# Patient Record
Sex: Female | Born: 1950 | Race: White | Hispanic: No | Marital: Married | Smoking: Former smoker
Health system: Southern US, Community
[De-identification: ages and names within clinical notes are randomized; demographics above are authoritative.]

## PROBLEM LIST (undated history)

## (undated) DIAGNOSIS — M1A00X0 Idiopathic chronic gout, unspecified site, without tophus (tophi): Secondary | ICD-10-CM

## (undated) DIAGNOSIS — K219 Gastro-esophageal reflux disease without esophagitis: Secondary | ICD-10-CM

## (undated) DIAGNOSIS — N2581 Secondary hyperparathyroidism of renal origin: Secondary | ICD-10-CM

## (undated) DIAGNOSIS — I10 Essential (primary) hypertension: Secondary | ICD-10-CM

## (undated) DIAGNOSIS — D649 Anemia, unspecified: Secondary | ICD-10-CM

## (undated) DIAGNOSIS — N289 Disorder of kidney and ureter, unspecified: Secondary | ICD-10-CM

## (undated) DIAGNOSIS — R0600 Dyspnea, unspecified: Secondary | ICD-10-CM

## (undated) DIAGNOSIS — Z972 Presence of dental prosthetic device (complete) (partial): Secondary | ICD-10-CM

## (undated) DIAGNOSIS — M1990 Unspecified osteoarthritis, unspecified site: Secondary | ICD-10-CM

## (undated) DIAGNOSIS — E559 Vitamin D deficiency, unspecified: Secondary | ICD-10-CM

## (undated) DIAGNOSIS — R946 Abnormal results of thyroid function studies: Secondary | ICD-10-CM

## (undated) DIAGNOSIS — J90 Pleural effusion, not elsewhere classified: Secondary | ICD-10-CM

## (undated) DIAGNOSIS — I2510 Atherosclerotic heart disease of native coronary artery without angina pectoris: Secondary | ICD-10-CM

## (undated) DIAGNOSIS — R700 Elevated erythrocyte sedimentation rate: Secondary | ICD-10-CM

## (undated) DIAGNOSIS — N183 Chronic kidney disease, stage 3 (moderate): Secondary | ICD-10-CM

## (undated) DIAGNOSIS — E785 Hyperlipidemia, unspecified: Secondary | ICD-10-CM

## (undated) DIAGNOSIS — R768 Other specified abnormal immunological findings in serum: Secondary | ICD-10-CM

## (undated) DIAGNOSIS — E790 Hyperuricemia without signs of inflammatory arthritis and tophaceous disease: Secondary | ICD-10-CM

## (undated) HISTORY — DX: Abnormal results of thyroid function studies: R94.6

## (undated) HISTORY — DX: Hyperuricemia without signs of inflammatory arthritis and tophaceous disease: E79.0

## (undated) HISTORY — DX: Elevated erythrocyte sedimentation rate: R70.0

## (undated) HISTORY — DX: Chronic kidney disease, stage 3 (moderate): N18.3

## (undated) HISTORY — DX: Hyperlipidemia, unspecified: E78.5

## (undated) HISTORY — PX: TUBAL LIGATION: SHX77

## (undated) HISTORY — DX: Vitamin D deficiency, unspecified: E55.9

## (undated) HISTORY — DX: Secondary hyperparathyroidism of renal origin: N25.81

## (undated) HISTORY — DX: Idiopathic chronic gout, unspecified site, without tophus (tophi): M1A.00X0

## (undated) HISTORY — DX: Atherosclerotic heart disease of native coronary artery without angina pectoris: I25.10

## (undated) HISTORY — DX: Other specified abnormal immunological findings in serum: R76.8

---

## 1998-07-20 HISTORY — PX: DILATION AND CURETTAGE OF UTERUS: SHX78

## 2005-11-23 ENCOUNTER — Emergency Department: Payer: Self-pay | Admitting: Emergency Medicine

## 2009-09-08 ENCOUNTER — Emergency Department: Payer: Self-pay | Admitting: Emergency Medicine

## 2014-05-31 DIAGNOSIS — I10 Essential (primary) hypertension: Secondary | ICD-10-CM | POA: Insufficient documentation

## 2014-05-31 DIAGNOSIS — E785 Hyperlipidemia, unspecified: Secondary | ICD-10-CM

## 2014-05-31 HISTORY — DX: Hyperlipidemia, unspecified: E78.5

## 2015-07-21 DIAGNOSIS — N2581 Secondary hyperparathyroidism of renal origin: Secondary | ICD-10-CM

## 2015-07-21 DIAGNOSIS — E559 Vitamin D deficiency, unspecified: Secondary | ICD-10-CM

## 2015-07-21 HISTORY — DX: Secondary hyperparathyroidism of renal origin: N25.81

## 2015-07-21 HISTORY — DX: Vitamin D deficiency, unspecified: E55.9

## 2015-09-20 DIAGNOSIS — R05 Cough: Secondary | ICD-10-CM | POA: Diagnosis not present

## 2015-09-20 DIAGNOSIS — N390 Urinary tract infection, site not specified: Secondary | ICD-10-CM | POA: Diagnosis not present

## 2015-09-23 DIAGNOSIS — L298 Other pruritus: Secondary | ICD-10-CM | POA: Diagnosis not present

## 2015-10-09 DIAGNOSIS — E7800 Pure hypercholesterolemia, unspecified: Secondary | ICD-10-CM | POA: Diagnosis not present

## 2015-10-09 DIAGNOSIS — M5136 Other intervertebral disc degeneration, lumbar region: Secondary | ICD-10-CM | POA: Diagnosis not present

## 2015-10-09 DIAGNOSIS — I10 Essential (primary) hypertension: Secondary | ICD-10-CM | POA: Diagnosis not present

## 2015-10-09 DIAGNOSIS — M545 Low back pain: Secondary | ICD-10-CM | POA: Diagnosis not present

## 2016-04-13 DIAGNOSIS — E7800 Pure hypercholesterolemia, unspecified: Secondary | ICD-10-CM | POA: Diagnosis not present

## 2016-04-13 DIAGNOSIS — Z6841 Body Mass Index (BMI) 40.0 and over, adult: Secondary | ICD-10-CM | POA: Diagnosis not present

## 2016-04-13 DIAGNOSIS — I10 Essential (primary) hypertension: Secondary | ICD-10-CM | POA: Diagnosis not present

## 2016-05-19 DIAGNOSIS — N183 Chronic kidney disease, stage 3 (moderate): Secondary | ICD-10-CM | POA: Diagnosis not present

## 2016-05-19 DIAGNOSIS — R809 Proteinuria, unspecified: Secondary | ICD-10-CM | POA: Diagnosis not present

## 2016-05-19 DIAGNOSIS — I10 Essential (primary) hypertension: Secondary | ICD-10-CM | POA: Diagnosis not present

## 2016-06-02 DIAGNOSIS — N183 Chronic kidney disease, stage 3 (moderate): Secondary | ICD-10-CM | POA: Diagnosis not present

## 2016-06-23 DIAGNOSIS — N183 Chronic kidney disease, stage 3 (moderate): Secondary | ICD-10-CM | POA: Diagnosis not present

## 2016-06-23 DIAGNOSIS — N2581 Secondary hyperparathyroidism of renal origin: Secondary | ICD-10-CM | POA: Diagnosis not present

## 2016-06-23 DIAGNOSIS — I10 Essential (primary) hypertension: Secondary | ICD-10-CM | POA: Diagnosis not present

## 2016-06-23 DIAGNOSIS — R809 Proteinuria, unspecified: Secondary | ICD-10-CM | POA: Diagnosis not present

## 2016-07-04 DIAGNOSIS — Z885 Allergy status to narcotic agent status: Secondary | ICD-10-CM | POA: Diagnosis not present

## 2016-07-04 DIAGNOSIS — R918 Other nonspecific abnormal finding of lung field: Secondary | ICD-10-CM | POA: Diagnosis not present

## 2016-07-04 DIAGNOSIS — Z87891 Personal history of nicotine dependence: Secondary | ICD-10-CM | POA: Diagnosis not present

## 2016-07-04 DIAGNOSIS — R079 Chest pain, unspecified: Secondary | ICD-10-CM | POA: Diagnosis not present

## 2016-07-04 DIAGNOSIS — M25512 Pain in left shoulder: Secondary | ICD-10-CM | POA: Diagnosis not present

## 2016-07-04 DIAGNOSIS — I700 Atherosclerosis of aorta: Secondary | ICD-10-CM | POA: Diagnosis not present

## 2016-07-04 DIAGNOSIS — I10 Essential (primary) hypertension: Secondary | ICD-10-CM | POA: Diagnosis not present

## 2016-07-04 DIAGNOSIS — M542 Cervicalgia: Secondary | ICD-10-CM | POA: Diagnosis not present

## 2016-07-04 DIAGNOSIS — M549 Dorsalgia, unspecified: Secondary | ICD-10-CM | POA: Diagnosis not present

## 2016-07-05 DIAGNOSIS — I700 Atherosclerosis of aorta: Secondary | ICD-10-CM | POA: Diagnosis not present

## 2016-07-05 DIAGNOSIS — R079 Chest pain, unspecified: Secondary | ICD-10-CM | POA: Diagnosis not present

## 2016-07-08 DIAGNOSIS — I10 Essential (primary) hypertension: Secondary | ICD-10-CM | POA: Diagnosis not present

## 2016-07-08 DIAGNOSIS — M542 Cervicalgia: Secondary | ICD-10-CM | POA: Diagnosis not present

## 2016-09-09 DIAGNOSIS — L918 Other hypertrophic disorders of the skin: Secondary | ICD-10-CM | POA: Diagnosis not present

## 2016-10-07 DIAGNOSIS — L821 Other seborrheic keratosis: Secondary | ICD-10-CM | POA: Diagnosis not present

## 2016-10-07 DIAGNOSIS — L918 Other hypertrophic disorders of the skin: Secondary | ICD-10-CM | POA: Diagnosis not present

## 2016-10-21 DIAGNOSIS — Z1159 Encounter for screening for other viral diseases: Secondary | ICD-10-CM | POA: Diagnosis not present

## 2016-10-21 DIAGNOSIS — N183 Chronic kidney disease, stage 3 (moderate): Secondary | ICD-10-CM | POA: Diagnosis not present

## 2016-10-21 DIAGNOSIS — Z6841 Body Mass Index (BMI) 40.0 and over, adult: Secondary | ICD-10-CM | POA: Diagnosis not present

## 2016-10-21 DIAGNOSIS — E7800 Pure hypercholesterolemia, unspecified: Secondary | ICD-10-CM | POA: Diagnosis not present

## 2016-10-21 DIAGNOSIS — R739 Hyperglycemia, unspecified: Secondary | ICD-10-CM | POA: Diagnosis not present

## 2016-10-21 DIAGNOSIS — I10 Essential (primary) hypertension: Secondary | ICD-10-CM | POA: Diagnosis not present

## 2016-10-27 DIAGNOSIS — R809 Proteinuria, unspecified: Secondary | ICD-10-CM | POA: Diagnosis not present

## 2016-10-27 DIAGNOSIS — N2581 Secondary hyperparathyroidism of renal origin: Secondary | ICD-10-CM | POA: Diagnosis not present

## 2016-10-27 DIAGNOSIS — I10 Essential (primary) hypertension: Secondary | ICD-10-CM | POA: Diagnosis not present

## 2016-10-27 DIAGNOSIS — N183 Chronic kidney disease, stage 3 (moderate): Secondary | ICD-10-CM | POA: Diagnosis not present

## 2016-11-09 DIAGNOSIS — R809 Proteinuria, unspecified: Secondary | ICD-10-CM | POA: Diagnosis not present

## 2016-11-09 DIAGNOSIS — N183 Chronic kidney disease, stage 3 (moderate): Secondary | ICD-10-CM | POA: Diagnosis not present

## 2016-11-09 DIAGNOSIS — I10 Essential (primary) hypertension: Secondary | ICD-10-CM | POA: Diagnosis not present

## 2016-11-09 DIAGNOSIS — Z6841 Body Mass Index (BMI) 40.0 and over, adult: Secondary | ICD-10-CM | POA: Diagnosis not present

## 2016-12-21 DIAGNOSIS — J020 Streptococcal pharyngitis: Secondary | ICD-10-CM | POA: Diagnosis not present

## 2016-12-21 DIAGNOSIS — M62838 Other muscle spasm: Secondary | ICD-10-CM | POA: Diagnosis not present

## 2016-12-21 DIAGNOSIS — J029 Acute pharyngitis, unspecified: Secondary | ICD-10-CM | POA: Diagnosis not present

## 2017-01-21 DIAGNOSIS — Z0000 Encounter for general adult medical examination without abnormal findings: Secondary | ICD-10-CM | POA: Diagnosis not present

## 2017-01-27 DIAGNOSIS — N183 Chronic kidney disease, stage 3 unspecified: Secondary | ICD-10-CM | POA: Insufficient documentation

## 2017-01-27 DIAGNOSIS — R0602 Shortness of breath: Secondary | ICD-10-CM | POA: Diagnosis not present

## 2017-01-27 DIAGNOSIS — D259 Leiomyoma of uterus, unspecified: Secondary | ICD-10-CM | POA: Diagnosis not present

## 2017-01-27 DIAGNOSIS — I129 Hypertensive chronic kidney disease with stage 1 through stage 4 chronic kidney disease, or unspecified chronic kidney disease: Secondary | ICD-10-CM | POA: Diagnosis not present

## 2017-01-27 DIAGNOSIS — R200 Anesthesia of skin: Secondary | ICD-10-CM | POA: Diagnosis not present

## 2017-01-27 DIAGNOSIS — E559 Vitamin D deficiency, unspecified: Secondary | ICD-10-CM | POA: Diagnosis not present

## 2017-01-27 DIAGNOSIS — Z885 Allergy status to narcotic agent status: Secondary | ICD-10-CM | POA: Diagnosis not present

## 2017-01-27 DIAGNOSIS — R202 Paresthesia of skin: Secondary | ICD-10-CM | POA: Diagnosis not present

## 2017-01-27 DIAGNOSIS — E785 Hyperlipidemia, unspecified: Secondary | ICD-10-CM | POA: Diagnosis not present

## 2017-01-27 DIAGNOSIS — Z6841 Body Mass Index (BMI) 40.0 and over, adult: Secondary | ICD-10-CM | POA: Diagnosis not present

## 2017-01-27 DIAGNOSIS — Z7982 Long term (current) use of aspirin: Secondary | ICD-10-CM | POA: Diagnosis not present

## 2017-01-27 DIAGNOSIS — N83201 Unspecified ovarian cyst, right side: Secondary | ICD-10-CM | POA: Diagnosis not present

## 2017-01-27 DIAGNOSIS — R0789 Other chest pain: Secondary | ICD-10-CM | POA: Diagnosis not present

## 2017-01-27 DIAGNOSIS — N83202 Unspecified ovarian cyst, left side: Secondary | ICD-10-CM | POA: Diagnosis not present

## 2017-01-27 DIAGNOSIS — R079 Chest pain, unspecified: Secondary | ICD-10-CM | POA: Diagnosis not present

## 2017-01-27 DIAGNOSIS — R000 Tachycardia, unspecified: Secondary | ICD-10-CM | POA: Diagnosis not present

## 2017-01-27 DIAGNOSIS — Z87891 Personal history of nicotine dependence: Secondary | ICD-10-CM | POA: Diagnosis not present

## 2017-01-27 HISTORY — DX: Chronic kidney disease, stage 3 unspecified: N18.30

## 2017-01-28 DIAGNOSIS — R079 Chest pain, unspecified: Secondary | ICD-10-CM | POA: Insufficient documentation

## 2017-01-28 DIAGNOSIS — N83201 Unspecified ovarian cyst, right side: Secondary | ICD-10-CM | POA: Diagnosis not present

## 2017-01-28 DIAGNOSIS — I700 Atherosclerosis of aorta: Secondary | ICD-10-CM | POA: Diagnosis not present

## 2017-01-28 DIAGNOSIS — R200 Anesthesia of skin: Secondary | ICD-10-CM | POA: Diagnosis not present

## 2017-01-28 DIAGNOSIS — I519 Heart disease, unspecified: Secondary | ICD-10-CM | POA: Diagnosis not present

## 2017-01-28 DIAGNOSIS — I517 Cardiomegaly: Secondary | ICD-10-CM | POA: Diagnosis not present

## 2017-01-28 DIAGNOSIS — N83202 Unspecified ovarian cyst, left side: Secondary | ICD-10-CM | POA: Diagnosis not present

## 2017-02-03 DIAGNOSIS — I25119 Atherosclerotic heart disease of native coronary artery with unspecified angina pectoris: Secondary | ICD-10-CM | POA: Diagnosis not present

## 2017-02-03 DIAGNOSIS — R079 Chest pain, unspecified: Secondary | ICD-10-CM | POA: Diagnosis not present

## 2017-02-12 DIAGNOSIS — I10 Essential (primary) hypertension: Secondary | ICD-10-CM | POA: Diagnosis not present

## 2017-02-12 DIAGNOSIS — N183 Chronic kidney disease, stage 3 (moderate): Secondary | ICD-10-CM | POA: Diagnosis not present

## 2017-02-12 DIAGNOSIS — R0789 Other chest pain: Secondary | ICD-10-CM | POA: Diagnosis not present

## 2017-02-12 DIAGNOSIS — E7800 Pure hypercholesterolemia, unspecified: Secondary | ICD-10-CM | POA: Diagnosis not present

## 2017-02-12 DIAGNOSIS — R946 Abnormal results of thyroid function studies: Secondary | ICD-10-CM | POA: Diagnosis not present

## 2017-02-12 DIAGNOSIS — Z09 Encounter for follow-up examination after completed treatment for conditions other than malignant neoplasm: Secondary | ICD-10-CM | POA: Diagnosis not present

## 2017-02-17 DIAGNOSIS — I2510 Atherosclerotic heart disease of native coronary artery without angina pectoris: Secondary | ICD-10-CM | POA: Diagnosis not present

## 2017-02-17 DIAGNOSIS — E7800 Pure hypercholesterolemia, unspecified: Secondary | ICD-10-CM | POA: Diagnosis not present

## 2017-02-17 DIAGNOSIS — I10 Essential (primary) hypertension: Secondary | ICD-10-CM | POA: Diagnosis not present

## 2017-02-17 HISTORY — DX: Atherosclerotic heart disease of native coronary artery without angina pectoris: I25.10

## 2017-03-02 DIAGNOSIS — N183 Chronic kidney disease, stage 3 (moderate): Secondary | ICD-10-CM | POA: Diagnosis not present

## 2017-03-02 DIAGNOSIS — R809 Proteinuria, unspecified: Secondary | ICD-10-CM | POA: Diagnosis not present

## 2017-03-02 DIAGNOSIS — I10 Essential (primary) hypertension: Secondary | ICD-10-CM | POA: Diagnosis not present

## 2017-03-02 DIAGNOSIS — N2581 Secondary hyperparathyroidism of renal origin: Secondary | ICD-10-CM | POA: Diagnosis not present

## 2017-06-22 DIAGNOSIS — N183 Chronic kidney disease, stage 3 (moderate): Secondary | ICD-10-CM | POA: Diagnosis not present

## 2017-06-22 DIAGNOSIS — I10 Essential (primary) hypertension: Secondary | ICD-10-CM | POA: Diagnosis not present

## 2017-06-22 DIAGNOSIS — N2581 Secondary hyperparathyroidism of renal origin: Secondary | ICD-10-CM | POA: Diagnosis not present

## 2017-06-22 DIAGNOSIS — R809 Proteinuria, unspecified: Secondary | ICD-10-CM | POA: Diagnosis not present

## 2017-07-26 DIAGNOSIS — Z6841 Body Mass Index (BMI) 40.0 and over, adult: Secondary | ICD-10-CM | POA: Diagnosis not present

## 2017-07-26 DIAGNOSIS — I10 Essential (primary) hypertension: Secondary | ICD-10-CM | POA: Diagnosis not present

## 2017-07-26 DIAGNOSIS — E7800 Pure hypercholesterolemia, unspecified: Secondary | ICD-10-CM | POA: Diagnosis not present

## 2017-07-26 DIAGNOSIS — N183 Chronic kidney disease, stage 3 (moderate): Secondary | ICD-10-CM | POA: Diagnosis not present

## 2017-07-26 DIAGNOSIS — Z79899 Other long term (current) drug therapy: Secondary | ICD-10-CM | POA: Diagnosis not present

## 2017-08-03 DIAGNOSIS — I2510 Atherosclerotic heart disease of native coronary artery without angina pectoris: Secondary | ICD-10-CM | POA: Diagnosis not present

## 2017-08-03 DIAGNOSIS — I10 Essential (primary) hypertension: Secondary | ICD-10-CM | POA: Diagnosis not present

## 2017-08-03 DIAGNOSIS — E7800 Pure hypercholesterolemia, unspecified: Secondary | ICD-10-CM | POA: Diagnosis not present

## 2017-08-03 DIAGNOSIS — N183 Chronic kidney disease, stage 3 (moderate): Secondary | ICD-10-CM | POA: Diagnosis not present

## 2017-10-04 ENCOUNTER — Emergency Department: Payer: PPO

## 2017-10-04 ENCOUNTER — Other Ambulatory Visit: Payer: Self-pay

## 2017-10-04 ENCOUNTER — Emergency Department
Admission: EM | Admit: 2017-10-04 | Discharge: 2017-10-04 | Disposition: A | Discharge status: Home / Self Care | Payer: PPO | Attending: Emergency Medicine | Admitting: Emergency Medicine

## 2017-10-04 DIAGNOSIS — Z79899 Other long term (current) drug therapy: Secondary | ICD-10-CM | POA: Insufficient documentation

## 2017-10-04 DIAGNOSIS — I10 Essential (primary) hypertension: Secondary | ICD-10-CM | POA: Insufficient documentation

## 2017-10-04 DIAGNOSIS — Z7982 Long term (current) use of aspirin: Secondary | ICD-10-CM | POA: Diagnosis not present

## 2017-10-04 DIAGNOSIS — M19011 Primary osteoarthritis, right shoulder: Secondary | ICD-10-CM | POA: Diagnosis not present

## 2017-10-04 DIAGNOSIS — M25511 Pain in right shoulder: Secondary | ICD-10-CM | POA: Diagnosis not present

## 2017-10-04 HISTORY — DX: Unspecified osteoarthritis, unspecified site: M19.90

## 2017-10-04 HISTORY — DX: Essential (primary) hypertension: I10

## 2017-10-04 HISTORY — DX: Hyperlipidemia, unspecified: E78.5

## 2017-10-04 HISTORY — DX: Disorder of kidney and ureter, unspecified: N28.9

## 2017-10-04 MED ORDER — KETOROLAC TROMETHAMINE 60 MG/2ML IM SOLN
60.0000 mg | Freq: Once | INTRAMUSCULAR | Status: AC
  Administered 2017-10-04: 60 mg via INTRAMUSCULAR
  Filled 2017-10-04: qty 2

## 2017-10-04 MED ORDER — MELOXICAM 7.5 MG PO TABS: 7.5000 mg | ORAL_TABLET | Freq: Every day | ORAL | 2 refills | Status: DC

## 2017-10-04 NOTE — ED Triage Notes (Signed)
Pt c/o right shoulder pain with decreased movement since yesterday. Denies injury.Marland Kitchen

## 2017-10-04 NOTE — ED Notes (Signed)
Computer did not allow patient to e-sign discharge.  Patient verbalized understanding of discharge instructions.  This RN witnessed.

## 2017-10-04 NOTE — ED Notes (Signed)
See triage note  Presents with pain to right shoulder   States pain started this am  States pain is going from shoulder and moves into right arm  Pain increases with movement  No deformity noted   Good pulses

## 2017-10-04 NOTE — Discharge Instructions (Signed)
Arm sling for 2-3 days as needed

## 2017-10-04 NOTE — ED Provider Notes (Signed)
[facility] Emergency Department Provider Note   ____________________________________________   None    (approximate)  I have reviewed the triage vital signs and the nursing notes.   HISTORY  Chief Complaint Shoulder Pain    HPI Pamela Young is a 67 y.o. female complaint onset of acute right shoulder pain and decreased range of motion with abduction overhead reaching for 1 day.  Patient denies provocative incident for complaint.  Patient state has a history of arthritis.  Patient the pain is located at the [facility] joint.  Patient rates the pain as a 10/10.  No palates measured for complaint.  Patient denies loss of sensation.  Patient is right-hand dominant.   Past Medical History:  Diagnosis Date  . Arthritis   . Hyperlipemia   . Hypertension   . Renal disorder     There are no active problems to display for this patient.   Past Surgical History:  Procedure Laterality Date  . TUBAL LIGATION      Prior to Admission medications   Medication Sig Start Date End Date Taking? Authorizing Provider  amLODipine (NORVASC) 10 MG tablet Take 10 mg by mouth daily.   Yes [provider]  aspirin 81 MG chewable tablet Chew 81 mg by mouth daily.   Yes [provider]  atenolol (TENORMIN) 25 MG tablet Take 25 mg by mouth daily.   Yes [provider]  losartan (COZAAR) 100 MG tablet Take 100 mg by mouth daily.   Yes [provider]  lovastatin (MEVACOR) 20 MG tablet Take 20 mg by mouth at bedtime.   Yes [provider]  triamterene-hydrochlorothiazide (MAXZIDE) 75-50 MG tablet Take 1 tablet by mouth daily.   Yes [provider]  meloxicam (MOBIC) 7.5 MG tablet Take 1 tablet (7.5 mg total) by mouth daily. 10/04/17   Sable Feil, PA-C    Allergies Patient has no known allergies.  No family history on file.  Social History Social History   Tobacco Use  . Smoking status: Never Smoker  . Smokeless  tobacco: Never Used  Substance Use Topics  . Alcohol use: No    Frequency: Never  . Drug use: No    Review of Systems  Constitutional: No fever/chills Eyes: No visual changes. ENT: No sore throat. Cardiovascular: Denies chest pain. Respiratory: Denies shortness of breath. Gastrointestinal: No abdominal pain.  No nausea, no vomiting.  No diarrhea.  No constipation. Genitourinary: Negative for dysuria. Musculoskeletal: Right shoulder pain. Skin: Negative for rash. Neurological: Negative for headaches, focal weakness or numbness. Endocrine:Hyperlipidemia and hypertension.  Renal disorder.  ____________________________________________   PHYSICAL EXAM:  VITAL SIGNS: ED Triage Vitals [10/04/17 1623]  Enc Vitals Group     BP (!) 141/51     Pulse Rate (!) 106     Resp 18     Temp 98.4 F (36.9 C)     Temp Source Oral     SpO2 96 %     Weight 280 lb (127 kg)     Height 5\' 7"  (1.702 m)     Head Circumference      Peak Flow      Pain Score 10     Pain Loc      Pain Edu?      Excl. in [place]?    Constitutional: Alert and oriented. Well appearing and in no acute distress. Cardiovascular: Normal rate, regular rhythm. Grossly normal heart sounds.  Good peripheral circulation. Respiratory: Normal respiratory effort.  No  retractions. Lungs CTAB. Musculoskeletal: No obvious deformity to the right shoulder.  Patient has moderate guarding palpation to [facility] joint.  Decreased range of motion abduction overhead reaching.  Strength against resistance 3/5. Neurologic:  Normal speech and language. No gross focal neurologic deficits are appreciated. No gait instability. Skin:  Skin is warm, dry and intact. No rash noted. Psychiatric: Mood and affect are normal. Speech and behavior are normal.  ____________________________________________   LABS (all labs ordered are listed, but only abnormal results are displayed)  Labs Reviewed - No data to  display ____________________________________________  EKG   ____________________________________________  RADIOLOGY Mild degenerative changes of the [facility] joint right shoulder.   Official radiology report(s): Dg Shoulder Right  Result Date: 10/04/2017 CLINICAL DATA:  Right shoulder pain EXAM: RIGHT SHOULDER - 2+ VIEW COMPARISON:  None. FINDINGS: No fracture or dislocation.  Mild AC joint degenerative change. IMPRESSION: No acute osseous abnormality. Electronically Signed   By: Donavan Foil M.D.   On: 10/04/2017 17:24    ____________________________________________   PROCEDURES  Procedure(s) performed: None  Procedures  Critical Care performed: No  ____________________________________________   INITIAL IMPRESSION / ASSESSMENT AND PLAN / ED COURSE  As part of my medical decision making, I reviewed the following data within the electronic MEDICAL RECORD NUMBER  Right shoulder pain secondary to osteoarthritis.  Discussed x-ray findings with patient.  Patient given discharge care instruction advised follow-up with the [facility] for continued care.  Arm sling for 2-3 days as needed.  Take medication as directed.        ____________________________________________   FINAL CLINICAL IMPRESSION(S) / ED DIAGNOSES  Final diagnoses:  Primary osteoarthritis of right shoulder     ED Discharge Orders        Ordered    meloxicam (MOBIC) 7.5 MG tablet  Daily     10/04/17 1736       Note:  This document was prepared using Dragon voice recognition software and may include unintentional dictation errors.    Sable Feil, PA-C 10/04/17 1740    Darel Hong, MD 10/04/17 [phone]

## 2017-10-14 DIAGNOSIS — M25542 Pain in joints of left hand: Secondary | ICD-10-CM | POA: Diagnosis not present

## 2017-10-14 DIAGNOSIS — M25562 Pain in left knee: Secondary | ICD-10-CM | POA: Diagnosis not present

## 2017-10-20 DIAGNOSIS — R809 Proteinuria, unspecified: Secondary | ICD-10-CM | POA: Diagnosis not present

## 2017-10-20 DIAGNOSIS — N183 Chronic kidney disease, stage 3 (moderate): Secondary | ICD-10-CM | POA: Diagnosis not present

## 2017-10-20 DIAGNOSIS — N2581 Secondary hyperparathyroidism of renal origin: Secondary | ICD-10-CM | POA: Diagnosis not present

## 2017-10-20 DIAGNOSIS — I10 Essential (primary) hypertension: Secondary | ICD-10-CM | POA: Diagnosis not present

## 2017-11-11 DIAGNOSIS — M19031 Primary osteoarthritis, right wrist: Secondary | ICD-10-CM | POA: Diagnosis not present

## 2017-11-11 DIAGNOSIS — M19032 Primary osteoarthritis, left wrist: Secondary | ICD-10-CM | POA: Diagnosis not present

## 2017-11-21 ENCOUNTER — Encounter: Payer: Self-pay | Admitting: Emergency Medicine

## 2017-11-21 ENCOUNTER — Emergency Department
Admission: EM | Admit: 2017-11-21 | Discharge: 2017-11-21 | Disposition: A | Discharge status: Home / Self Care | Payer: PPO | Attending: Emergency Medicine | Admitting: Emergency Medicine

## 2017-11-21 ENCOUNTER — Other Ambulatory Visit: Payer: Self-pay

## 2017-11-21 DIAGNOSIS — I10 Essential (primary) hypertension: Secondary | ICD-10-CM | POA: Insufficient documentation

## 2017-11-21 DIAGNOSIS — Z7982 Long term (current) use of aspirin: Secondary | ICD-10-CM | POA: Insufficient documentation

## 2017-11-21 DIAGNOSIS — R319 Hematuria, unspecified: Secondary | ICD-10-CM | POA: Diagnosis present

## 2017-11-21 DIAGNOSIS — N390 Urinary tract infection, site not specified: Secondary | ICD-10-CM | POA: Diagnosis not present

## 2017-11-21 DIAGNOSIS — Z79899 Other long term (current) drug therapy: Secondary | ICD-10-CM | POA: Insufficient documentation

## 2017-11-21 LAB — CBC
HCT: 33.7 % — ABNORMAL LOW (ref 35.0–47.0)
Hemoglobin: 11.6 g/dL — ABNORMAL LOW (ref 12.0–16.0)
MCH: 30.7 pg (ref 26.0–34.0)
MCHC: 34.5 g/dL (ref 32.0–36.0)
MCV: 88.9 fL (ref 80.0–100.0)
Platelets: 299 10*3/uL (ref 150–440)
RBC: 3.79 MIL/uL — ABNORMAL LOW (ref 3.80–5.20)
RDW: 15.1 % — AB (ref 11.5–14.5)
WBC: 13.6 10*3/uL — AB (ref 3.6–11.0)

## 2017-11-21 LAB — BASIC METABOLIC PANEL
ANION GAP: 9 (ref 5–15)
BUN: 45 mg/dL — ABNORMAL HIGH (ref 6–20)
CALCIUM: 8.9 mg/dL (ref 8.9–10.3)
CO2: 18 mmol/L — ABNORMAL LOW (ref 22–32)
Chloride: 105 mmol/L (ref 101–111)
Creatinine, Ser: 1.5 mg/dL — ABNORMAL HIGH (ref 0.44–1.00)
GFR, EST AFRICAN AMERICAN: 40 mL/min — AB (ref >60)
GFR, EST NON AFRICAN AMERICAN: 35 mL/min — AB (ref >60)
GLUCOSE: 139 mg/dL — AB (ref 65–99)
Potassium: 4.2 mmol/L (ref 3.5–5.1)
Sodium: 132 mmol/L — ABNORMAL LOW (ref 135–145)

## 2017-11-21 LAB — URINALYSIS, COMPLETE (UACMP) WITH MICROSCOPIC
Bacteria, UA: NONE SEEN
Specific Gravity, Urine: 1.013 (ref 1.005–1.030)

## 2017-11-21 MED ORDER — CEPHALEXIN 500 MG PO CAPS
500.0000 mg | ORAL_CAPSULE | Freq: Once | ORAL | Status: AC
  Administered 2017-11-21: 500 mg via ORAL
  Filled 2017-11-21: qty 1

## 2017-11-21 MED ORDER — CEPHALEXIN 500 MG PO CAPS: 500.0000 mg | ORAL_CAPSULE | Freq: Two times a day (BID) | ORAL | 0 refills | Status: DC

## 2017-11-21 NOTE — ED Notes (Signed)
Pt ambulatory to and from toilet without assistance.

## 2017-11-21 NOTE — ED Triage Notes (Signed)
FIRST NURSE NOTE-here for UTI symptoms. Appears well, NAD

## 2017-11-21 NOTE — ED Triage Notes (Signed)
Pt arrived via POV with reports of hematuria and dysuria that started this morning. Pt has hx of UTIs. Denies any fever,

## 2017-11-21 NOTE — ED Provider Notes (Signed)
[facility] Emergency Department Provider Note   ____________________________________________    I have reviewed the triage vital signs and the nursing notes.   HISTORY  Chief Complaint Dysuria and Hematuria     HPI Pamela Young is a 67 y.o. femalewho presents with complaints of burning with urination as well as hematuria.  First noted this this morning.  Went to the store and took some Azo but no significant improvement.  Denies fevers or chills.  No back pain.  History of UTI in the past and this feels similar.   Past Medical History:  Diagnosis Date  . Arthritis   . Hyperlipemia   . Hypertension   . Renal disorder     There are no active problems to display for this patient.   Past Surgical History:  Procedure Laterality Date  . TUBAL LIGATION      Prior to Admission medications   Medication Sig Start Date End Date Taking? Authorizing Provider  atorvastatin (LIPITOR) 20 MG tablet Take 1 tablet by mouth daily. 11/17/17  Yes [provider]  hydrALAZINE (APRESOLINE) 100 MG tablet Take 1 tablet by mouth 3 (three) times daily. 11/17/17  Yes [provider]  amLODipine (NORVASC) 10 MG tablet Take 10 mg by mouth daily.    [provider]  aspirin 81 MG chewable tablet Chew 81 mg by mouth daily.    [provider]  atenolol (TENORMIN) 25 MG tablet Take 25 mg by mouth daily.    [provider]  cephALEXin (KEFLEX) 500 MG capsule Take 1 capsule (500 mg total) by mouth 2 (two) times daily. 11/21/17   Lavonia Drafts, MD  losartan (COZAAR) 100 MG tablet Take 100 mg by mouth daily.    [provider]  lovastatin (MEVACOR) 20 MG tablet Take 20 mg by mouth at bedtime.    [provider]  meloxicam (MOBIC) 7.5 MG tablet Take 1 tablet (7.5 mg total) by mouth daily. 10/04/17   Sable Feil, PA-C  triamterene-hydrochlorothiazide (MAXZIDE) 75-50 MG tablet Take 1 tablet by mouth daily.    [provider]     Allergies Codeine  History reviewed. No pertinent family history.  Social History Social History   Tobacco Use  . Smoking status: Never Smoker  . Smokeless tobacco: Never Used  Substance Use Topics  . Alcohol use: No    Frequency: Never  . Drug use: No    Review of Systems  Constitutional: No fever/chills  ENT: No sore throat.   Gastrointestinal: No abdominal pain.  No nausea, no vomiting.   Genitourinary: As above. Musculoskeletal: Negative for back pain. Skin: Negative for rash. Neurological: Negative for headaches     ____________________________________________   PHYSICAL EXAM:  VITAL SIGNS: ED Triage Vitals  Enc Vitals Group     BP 11/21/17 1022 (!) 156/59     Pulse Rate 11/21/17 1022 89     Resp 11/21/17 1022 18     Temp 11/21/17 1022 98.1 F (36.7 C)     Temp Source 11/21/17 1022 Oral     SpO2 11/21/17 1022 94 %     Weight 11/21/17 1023 124.3 kg (274 lb)     Height 11/21/17 1023 1.702 m (5\' 7" )     Head Circumference --      Peak Flow --      Pain Score 11/21/17 1022 7     Pain Loc --      Pain Edu? --  Excl. in [place]? --      Constitutional: Alert and oriented. No acute distress. Pleasant and interactive  Mouth/Throat: Mucous membranes are moist.   Cardiovascular: Normal rate, regular rhythm.  Respiratory: Normal respiratory effort.  No retractions. Genitourinary: No suprapubic tenderness, no CVA tenderness Musculoskeletal: No lower extremity tenderness nor edema.   Neurologic:  Normal speech and language. No gross focal neurologic deficits are appreciated.   Skin:  Skin is warm, dry and intact. No rash noted.   ____________________________________________   LABS (all labs ordered are listed, but only abnormal results are displayed)  Labs Reviewed  URINALYSIS, COMPLETE (UACMP) WITH MICROSCOPIC - Abnormal; Notable for the following components:      Result Value   Color, Urine ORANGE (*)    APPearance CLOUDY (*)     Glucose, UA   (*)    Value: TEST NOT REPORTED DUE TO COLOR INTERFERENCE OF URINE PIGMENT   Hgb urine dipstick   (*)    Value: TEST NOT REPORTED DUE TO COLOR INTERFERENCE OF URINE PIGMENT   Bilirubin Urine   (*)    Value: TEST NOT REPORTED DUE TO COLOR INTERFERENCE OF URINE PIGMENT   Ketones, ur   (*)    Value: TEST NOT REPORTED DUE TO COLOR INTERFERENCE OF URINE PIGMENT   Protein, ur   (*)    Value: TEST NOT REPORTED DUE TO COLOR INTERFERENCE OF URINE PIGMENT   Nitrite   (*)    Value: TEST NOT REPORTED DUE TO COLOR INTERFERENCE OF URINE PIGMENT   Leukocytes, UA   (*)    Value: TEST NOT REPORTED DUE TO COLOR INTERFERENCE OF URINE PIGMENT   RBC / HPF >50 (*)    All other components within normal limits  CBC - Abnormal; Notable for the following components:   WBC 13.6 (*)    RBC 3.79 (*)    Hemoglobin 11.6 (*)    HCT 33.7 (*)    RDW 15.1 (*)    All other components within normal limits  BASIC METABOLIC PANEL - Abnormal; Notable for the following components:   Sodium 132 (*)    CO2 18 (*)    Glucose, Bld 139 (*)    BUN 45 (*)    Creatinine, Ser 1.50 (*)    GFR calc non Af Amer 35 (*)    GFR calc Af Amer 40 (*)    All other components within normal limits   ____________________________________________  EKG   ____________________________________________  RADIOLOGY   ____________________________________________   PROCEDURES  Procedure(s) performed: No  Procedures   Critical Care performed: No ____________________________________________   INITIAL IMPRESSION / ASSESSMENT AND PLAN / ED COURSE  Pertinent labs & imaging results that were available during my care of the patient were reviewed by me and considered in my medical decision making (see chart for details).  Symptoms are consistent with UTI, mild elevation white blood cell count however afebrile vitals otherwise unremarkable.  21-50 white blood cells seen on urinalysis limited by Azo will treat with  Keflex, outpatient follow-up as needed   ____________________________________________   FINAL CLINICAL IMPRESSION(S) / ED DIAGNOSES  Final diagnoses:  Lower urinary tract infectious disease      NEW MEDICATIONS STARTED DURING THIS VISIT:  Discharge Medication List as of 11/21/2017 11:55 AM    START taking these medications   Details  cephALEXin (KEFLEX) 500 MG capsule Take 1 capsule (500 mg total) by mouth 2 (two) times daily., Starting Sun 11/21/2017, Print  Note:  This document was prepared using Dragon voice recognition software and may include unintentional dictation errors.    Lavonia Drafts, MD 11/21/17 [phone]

## 2017-11-26 DIAGNOSIS — M19031 Primary osteoarthritis, right wrist: Secondary | ICD-10-CM | POA: Diagnosis not present

## 2017-11-26 DIAGNOSIS — M25542 Pain in joints of left hand: Secondary | ICD-10-CM | POA: Diagnosis not present

## 2017-12-10 DIAGNOSIS — R700 Elevated erythrocyte sedimentation rate: Secondary | ICD-10-CM

## 2017-12-10 DIAGNOSIS — N183 Chronic kidney disease, stage 3 (moderate): Secondary | ICD-10-CM | POA: Diagnosis not present

## 2017-12-10 DIAGNOSIS — E790 Hyperuricemia without signs of inflammatory arthritis and tophaceous disease: Secondary | ICD-10-CM

## 2017-12-10 DIAGNOSIS — M1990 Unspecified osteoarthritis, unspecified site: Secondary | ICD-10-CM | POA: Diagnosis not present

## 2017-12-10 HISTORY — DX: Elevated erythrocyte sedimentation rate: R70.0

## 2017-12-10 HISTORY — DX: Hyperuricemia without signs of inflammatory arthritis and tophaceous disease: E79.0

## 2017-12-17 DIAGNOSIS — R350 Frequency of micturition: Secondary | ICD-10-CM | POA: Diagnosis not present

## 2017-12-17 DIAGNOSIS — R300 Dysuria: Secondary | ICD-10-CM | POA: Diagnosis not present

## 2017-12-17 DIAGNOSIS — I10 Essential (primary) hypertension: Secondary | ICD-10-CM | POA: Diagnosis not present

## 2017-12-22 DIAGNOSIS — M1A00X0 Idiopathic chronic gout, unspecified site, without tophus (tophi): Secondary | ICD-10-CM | POA: Diagnosis not present

## 2017-12-22 DIAGNOSIS — R768 Other specified abnormal immunological findings in serum: Secondary | ICD-10-CM | POA: Insufficient documentation

## 2017-12-22 DIAGNOSIS — M1A9XX0 Chronic gout, unspecified, without tophus (tophi): Secondary | ICD-10-CM | POA: Insufficient documentation

## 2017-12-22 DIAGNOSIS — N183 Chronic kidney disease, stage 3 (moderate): Secondary | ICD-10-CM | POA: Diagnosis not present

## 2017-12-22 DIAGNOSIS — M1990 Unspecified osteoarthritis, unspecified site: Secondary | ICD-10-CM | POA: Diagnosis not present

## 2017-12-22 HISTORY — DX: Other specified abnormal immunological findings in serum: R76.8

## 2017-12-22 HISTORY — DX: Idiopathic chronic gout, unspecified site, without tophus (tophi): M1A.00X0

## 2017-12-22 HISTORY — DX: Chronic gout, unspecified, without tophus (tophi): M1A.9XX0

## 2018-01-18 DIAGNOSIS — N183 Chronic kidney disease, stage 3 (moderate): Secondary | ICD-10-CM | POA: Diagnosis not present

## 2018-01-18 DIAGNOSIS — M1A00X0 Idiopathic chronic gout, unspecified site, without tophus (tophi): Secondary | ICD-10-CM | POA: Diagnosis not present

## 2018-01-25 DIAGNOSIS — Z6841 Body Mass Index (BMI) 40.0 and over, adult: Secondary | ICD-10-CM | POA: Diagnosis not present

## 2018-01-25 DIAGNOSIS — Z79899 Other long term (current) drug therapy: Secondary | ICD-10-CM | POA: Diagnosis not present

## 2018-01-25 DIAGNOSIS — Z0000 Encounter for general adult medical examination without abnormal findings: Secondary | ICD-10-CM | POA: Diagnosis not present

## 2018-01-25 DIAGNOSIS — Z1231 Encounter for screening mammogram for malignant neoplasm of breast: Secondary | ICD-10-CM | POA: Diagnosis not present

## 2018-01-25 DIAGNOSIS — E7800 Pure hypercholesterolemia, unspecified: Secondary | ICD-10-CM | POA: Diagnosis not present

## 2018-01-25 DIAGNOSIS — N183 Chronic kidney disease, stage 3 (moderate): Secondary | ICD-10-CM | POA: Diagnosis not present

## 2018-01-25 DIAGNOSIS — Z1211 Encounter for screening for malignant neoplasm of colon: Secondary | ICD-10-CM | POA: Diagnosis not present

## 2018-01-25 DIAGNOSIS — R946 Abnormal results of thyroid function studies: Secondary | ICD-10-CM | POA: Diagnosis not present

## 2018-01-25 DIAGNOSIS — Z23 Encounter for immunization: Secondary | ICD-10-CM | POA: Diagnosis not present

## 2018-01-25 DIAGNOSIS — I10 Essential (primary) hypertension: Secondary | ICD-10-CM | POA: Diagnosis not present

## 2018-01-31 DIAGNOSIS — I10 Essential (primary) hypertension: Secondary | ICD-10-CM | POA: Diagnosis not present

## 2018-01-31 DIAGNOSIS — I2510 Atherosclerotic heart disease of native coronary artery without angina pectoris: Secondary | ICD-10-CM | POA: Diagnosis not present

## 2018-01-31 DIAGNOSIS — E7800 Pure hypercholesterolemia, unspecified: Secondary | ICD-10-CM | POA: Diagnosis not present

## 2018-02-08 DIAGNOSIS — E872 Acidosis: Secondary | ICD-10-CM | POA: Diagnosis not present

## 2018-02-08 DIAGNOSIS — N2581 Secondary hyperparathyroidism of renal origin: Secondary | ICD-10-CM | POA: Diagnosis not present

## 2018-02-08 DIAGNOSIS — N183 Chronic kidney disease, stage 3 (moderate): Secondary | ICD-10-CM | POA: Diagnosis not present

## 2018-02-08 DIAGNOSIS — I10 Essential (primary) hypertension: Secondary | ICD-10-CM | POA: Diagnosis not present

## 2018-02-09 NOTE — Progress Notes (Signed)
02/10/2018 3:10 PM   Pamela Young 09/17/1950 [id number]  Referring provider: Sallee Lange, NP [address] [place], [place] [address]  Chief Complaint  Patient presents with  . Hematuria    New Patient  . Urinary Frequency    HPI: Patient is a 67 -year-old Caucasian female who presents today as a referral from Sallee Lange, NP for  frequency.    Patient was found to have gross hematuria in May 2019.  She was seen in the ED for UTI symptoms.  UA was > 50 RBC's and 21-50 WBC's.  No culture was sent.  Improvement with symptoms on antibiotics.   She has not seen any more gross hematuria since she has completed the antibiotic.    She does not have a prior history of recurrent urinary tract infections, nephrolithiasis, trauma to the genitourinary tract or malignancies of the genitourinary tract.   Her brother has a history of gross hematuria.  She does not have a family medical history of nephrolithiasis, malignancies of the genitourinary tract or hematuria.   Today, she is having symptoms of frequent urination and nocturia x 2.  These started in June when she was placed on prednisone taper for gout.  Patient denies any dysuria or suprapubic/flank pain.  Patient denies any fevers, chills, nausea or vomiting. Her UA today demonstrates no hematuria, 11-30 WBC's and many bacteria.  Her PVR today is 21 mL.   She former smoker.  She is exposed to secondhand smoke.  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    She has a high BMI.     PMH: Past Medical History:  Diagnosis Date  . Arthritis   . Borderline abnormal TFTs 02/10/2018   Overview:  Borderline TSH/FT4, that have improved, but has high TPO antibodies.  . Chronic gouty arthritis 12/22/2017  . CKD (chronic kidney disease) stage 3, GFR 30-59 ml/min (HCC) 01/27/2017   Overview:  Overview:  Being followed by Dr. Holley Raring Overview:  Being followed by Dr. Holley Raring  . Coronary artery disease involving  native coronary artery of native heart 02/17/2017   Overview:  By ct scan   . Elevated erythrocyte sedimentation rate 12/10/2017  . Hyperlipemia   . Hyperlipidemia 05/31/2014  . Hypertension   . Hyperuricemia 12/10/2017  . 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 Renal disorder   . Secondary hyperparathyroidism ([place]) 07/21/2015   Overview:  Overview:  Followed by Dr. Holley Raring Overview:  Followed by Dr. Holley Raring  . Vitamin D deficiency 07/21/2015   Overview:  Overview:  Vit D 15 (checked thru nephrology, who is supplementing w/ ergocalciferol). Overview:  Vit D 15 (checked thru nephrology, who is supplementing w/ ergocalciferol).    Surgical History: Past Surgical History:  Procedure Laterality Date  . TUBAL LIGATION      Home Medications:  Allergies as of 02/10/2018      Reactions   Codeine    'makes me feel funny'      Medication List        Accurate as of 02/10/18  3:10 PM. Always use your most recent med list.          allopurinol 100 MG tablet Commonly known as:  ZYLOPRIM TAKE 2 TABLETS BY MOUTH ONCE DAILY FOR 30 DAYS   amLODipine 10 MG tablet Commonly known as:  NORVASC Take 10 mg by mouth daily.   aspirin 81 MG chewable tablet Chew 81  mg by mouth daily.   atenolol 25 MG tablet Commonly known as:  TENORMIN Take 25 mg by mouth daily.   atorvastatin 20 MG tablet Commonly known as:  LIPITOR Take 1 tablet by mouth daily.   hydrALAZINE 100 MG tablet Commonly known as:  APRESOLINE Take 1 tablet by mouth 3 (three) times daily.   hydroxychloroquine 200 MG tablet Commonly known as:  PLAQUENIL TAKE 1 TABLET BY MOUTH TWICE DAILY FOR 30 DAYS   losartan 100 MG tablet Commonly known as:  COZAAR Take 100 mg by mouth daily.   lovastatin 20 MG tablet Commonly known as:  MEVACOR Take 20 mg by mouth at bedtime.   triamterene-hydrochlorothiazide 75-50 MG tablet Commonly known as:  MAXZIDE Take 1 tablet by mouth daily.        Allergies:  Allergies  Allergen Reactions  . Codeine     'makes me feel funny'    Family History: No family history on file.  Social History:  reports that she has quit smoking. She has never used smokeless tobacco. She reports that she does not drink alcohol or use drugs.  ROS: UROLOGY Frequent Urination?: Yes Hard to postpone urination?: No Burning/pain with urination?: No Get up at night to urinate?: Yes Leakage of urine?: No Urine stream starts and stops?: No Trouble starting stream?: No Do you have to strain to urinate?: No Blood in urine?: No Urinary tract infection?: No Sexually transmitted disease?: No Injury to kidneys or bladder?: No Painful intercourse?: No Weak stream?: No Currently pregnant?: No Vaginal bleeding?: No Last menstrual period?: n  Gastrointestinal Nausea?: No Vomiting?: No Indigestion/heartburn?: No Diarrhea?: No Constipation?: No  Constitutional Fever: No Night sweats?: No Weight loss?: No Fatigue?: No  Skin Skin rash/lesions?: No Itching?: No  Eyes Blurred vision?: No Double vision?: No  Ears/Nose/Throat Sore throat?: No Sinus problems?: No  Hematologic/Lymphatic Swollen glands?: No Easy bruising?: No  Cardiovascular Leg swelling?: No Chest pain?: No  Respiratory Cough?: No Shortness of breath?: No  Endocrine Excessive thirst?: No  Musculoskeletal Back pain?: Yes Joint pain?: Yes  Neurological Headaches?: No Dizziness?: No  Psychologic Depression?: No Anxiety?: No  Physical Exam: BP 127/70   Pulse 97   Ht 5\' 7"  (1.702 m)   Wt 260 lb (117.9 kg)   BMI 40.72 kg/m   Constitutional:  Well nourished. Alert and oriented, No acute distress. HEENT: [place] AT, moist mucus membranes.  Trachea midline, no masses. Cardiovascular: No clubbing, cyanosis, or edema. Respiratory: Normal respiratory effort, no increased work of breathing. GI: Abdomen is soft, non tender, non distended, no abdominal masses. Liver  and spleen not palpable.  No hernias appreciated.  Stool sample for occult testing is not indicated.   GU: No CVA tenderness.  No bladder fullness or masses.  Atrophic external genitalia, normal pubic hair distribution, no lesions.  Normal urethral meatus, no lesions, no prolapse, no discharge.   No urethral masses, tenderness and/or tenderness. No bladder fullness, tenderness or masses.  Pale vagina mucosa, poor estrogen effect, no discharge, no lesions, weak pelvic support, grade I cystocele. No rectocele noted.  No cervical motion tenderness.  Uterus is freely mobile and non-fixed.  No adnexal/parametria masses or tenderness noted.  Anus and perineum are without rashes or lesions.    Skin: No rashes, bruises or suspicious lesions. Lymph: No cervical or inguinal adenopathy. Neurologic: Grossly intact, no focal deficits, moving all 4 extremities. Psychiatric: Normal mood and affect.  Laboratory Data: Lab Results  Component Value Date   WBC 13.6 (  H) 11/21/2017   HGB 11.6 (L) 11/21/2017   HCT 33.7 (L) 11/21/2017   MCV 88.9 11/21/2017   PLT 299 11/21/2017    Lab Results  Component Value Date   CREATININE 1.50 (H) 11/21/2017    No results found for: PSA  No results found for: TESTOSTERONE  No results found for: HGBA1C  No results found for: TSH  No results found for: CHOL, HDL, CHOLHDL, VLDL, LDLCALC  No results found for: AST No results found for: ALT No components found for: ALKALINEPHOPHATASE No components found for: BILIRUBINTOTAL  No results found for: ESTRADIOL   Urinalysis Many bacteria.  11-30 WBC's.  See Epic.    I have reviewed the labs.  Pertinent Imaging: Results for KAROLYNN, INFANTINO (MRN [id number]) as of 02/10/2018 15:42  Ref. Range 02/10/2018 15:37  Scan Result Unknown 66ml    Assessment & Plan:    1. Gross hematuria Explained to the patient that there are a number of causes that can be associated with blood in the urine, such as stones, UTI's, damage to  the urinary tract and/or cancer - most likely due to infection as the hematuria cleared after she took the antibiotic - will continue to monitor - if returns will pursue hematuria workup   2. Frequency May be due to the prednisone and will abate once the prednisone has been through her system Given Myrbetriq 25 mg, # 28 samples, I have advised the patient of the side effects of Myrbetriq, such as: elevation in BP, urinary retention and/or HA. RTC in 3 weeks for OAB questionnaire and PVR   3. Vaginal atrophy Patient was given a sample of vaginal estrogen cream (Premarin vaginal cream) and instructed to apply 0.5mg  (pea-sized amount)  just inside the vaginal introitus with a finger-tip on Monday, Wednesday and Friday nights.  I explained to the patient that vaginally administered estrogen, which causes only a slight increase in the blood estrogen levels, have fewer contraindications and adverse systemic effects that oral HT. I have also given prescriptions for the Estrace cream and Premarin cream, so that the patient may carry them to the pharmacy to see which one of the branded creams would be most economical for her.  If she finds both medications cost prohibitive, she is instructed to call the office.  We can then call in a compounded vaginal estrogen cream for the patient that may be more affordable.   She will follow up in three months for an exam.     No follow-ups on file.  These notes generated with voice recognition software. I apologize for typographical errors.  Zara Council, PA-C  [facility] [address] [place]  [place], [place] [address] [phone]

## 2018-02-10 ENCOUNTER — Ambulatory Visit: Payer: PPO | Admitting: Urology

## 2018-02-10 ENCOUNTER — Encounter: Payer: Self-pay | Admitting: Urology

## 2018-02-10 VITALS — BP 127/70 | HR 97 | Ht 67.0 in | Wt 260.0 lb

## 2018-02-10 DIAGNOSIS — N952 Postmenopausal atrophic vaginitis: Secondary | ICD-10-CM

## 2018-02-10 DIAGNOSIS — R350 Frequency of micturition: Secondary | ICD-10-CM | POA: Diagnosis not present

## 2018-02-10 DIAGNOSIS — R946 Abnormal results of thyroid function studies: Secondary | ICD-10-CM

## 2018-02-10 DIAGNOSIS — R310 Gross hematuria: Secondary | ICD-10-CM | POA: Diagnosis not present

## 2018-02-10 HISTORY — DX: Abnormal results of thyroid function studies: R94.6

## 2018-02-10 LAB — URINALYSIS, COMPLETE
BILIRUBIN UA: NEGATIVE
Glucose, UA: NEGATIVE
KETONES UA: NEGATIVE
NITRITE UA: NEGATIVE
PH UA: 5.5 (ref 5.0–7.5)
RBC UA: NEGATIVE
SPEC GRAV UA: 1.025 (ref 1.005–1.030)
UUROB: 0.2 mg/dL (ref 0.2–1.0)

## 2018-02-10 LAB — BLADDER SCAN AMB NON-IMAGING

## 2018-02-10 LAB — MICROSCOPIC EXAMINATION: RBC MICROSCOPIC, UA: NONE SEEN /HPF (ref 0–2)

## 2018-02-10 MED ORDER — ESTRADIOL 0.1 MG/GM VA CREA: TOPICAL_CREAM | VAGINAL | 12 refills | Status: DC

## 2018-02-10 MED ORDER — MIRABEGRON ER 25 MG PO TB24: 25.0000 mg | ORAL_TABLET | Freq: Every day | ORAL | 0 refills | Status: DC

## 2018-02-10 MED ORDER — ESTROGENS, CONJUGATED 0.625 MG/GM VA CREA: TOPICAL_CREAM | VAGINAL | 12 refills | Status: DC

## 2018-02-10 NOTE — Patient Instructions (Signed)
I have given you two prescriptions for a vaginal estrogen cream.  Estrace and Premarin.  Please take these to your pharmacy and see which one your insurance covers.  If both are too expensive, please call the office at [phone] for an alternative.  You are given a sample of vaginal estrogen cream Premarin and instructed to apply 0.5mg (pea-sized amount)  just inside the vaginal introitus with a finger-tip on Monday, Wednesday and Friday nights,     

## 2018-02-11 LAB — BUN+CREAT
BUN / CREAT RATIO: 13 (ref 12–28)
BUN: 17 mg/dL (ref 8–27)
CREATININE: 1.36 mg/dL — AB (ref 0.57–1.00)
GFR, EST AFRICAN AMERICAN: 46 mL/min/{1.73_m2} — AB (ref >59)
GFR, EST NON AFRICAN AMERICAN: 40 mL/min/{1.73_m2} — AB (ref >59)

## 2018-02-13 LAB — CULTURE, URINE COMPREHENSIVE

## 2018-03-03 ENCOUNTER — Ambulatory Visit: Payer: PPO | Admitting: Urology

## 2018-03-07 DIAGNOSIS — N2581 Secondary hyperparathyroidism of renal origin: Secondary | ICD-10-CM | POA: Diagnosis not present

## 2018-03-07 DIAGNOSIS — M0600 Rheumatoid arthritis without rheumatoid factor, unspecified site: Secondary | ICD-10-CM | POA: Diagnosis not present

## 2018-03-07 DIAGNOSIS — M1A00X0 Idiopathic chronic gout, unspecified site, without tophus (tophi): Secondary | ICD-10-CM | POA: Diagnosis not present

## 2018-03-07 DIAGNOSIS — N183 Chronic kidney disease, stage 3 (moderate): Secondary | ICD-10-CM | POA: Diagnosis not present

## 2018-03-07 DIAGNOSIS — Z79899 Other long term (current) drug therapy: Secondary | ICD-10-CM | POA: Diagnosis not present

## 2018-03-07 DIAGNOSIS — M1990 Unspecified osteoarthritis, unspecified site: Secondary | ICD-10-CM | POA: Diagnosis not present

## 2018-05-30 DIAGNOSIS — Z79899 Other long term (current) drug therapy: Secondary | ICD-10-CM | POA: Diagnosis not present

## 2018-05-30 DIAGNOSIS — N183 Chronic kidney disease, stage 3 (moderate): Secondary | ICD-10-CM | POA: Diagnosis not present

## 2018-05-30 DIAGNOSIS — M1A00X0 Idiopathic chronic gout, unspecified site, without tophus (tophi): Secondary | ICD-10-CM | POA: Diagnosis not present

## 2018-06-09 DIAGNOSIS — E872 Acidosis: Secondary | ICD-10-CM | POA: Diagnosis not present

## 2018-06-09 DIAGNOSIS — N2581 Secondary hyperparathyroidism of renal origin: Secondary | ICD-10-CM | POA: Diagnosis not present

## 2018-06-09 DIAGNOSIS — N183 Chronic kidney disease, stage 3 (moderate): Secondary | ICD-10-CM | POA: Diagnosis not present

## 2018-06-09 DIAGNOSIS — M109 Gout, unspecified: Secondary | ICD-10-CM | POA: Diagnosis not present

## 2018-06-09 DIAGNOSIS — I10 Essential (primary) hypertension: Secondary | ICD-10-CM | POA: Diagnosis not present

## 2018-06-22 DIAGNOSIS — J00 Acute nasopharyngitis [common cold]: Secondary | ICD-10-CM | POA: Diagnosis not present

## 2018-06-22 DIAGNOSIS — J40 Bronchitis, not specified as acute or chronic: Secondary | ICD-10-CM | POA: Diagnosis not present

## 2018-06-29 DIAGNOSIS — R062 Wheezing: Secondary | ICD-10-CM | POA: Diagnosis not present

## 2018-06-29 DIAGNOSIS — R319 Hematuria, unspecified: Secondary | ICD-10-CM | POA: Diagnosis not present

## 2018-06-29 DIAGNOSIS — R300 Dysuria: Secondary | ICD-10-CM | POA: Diagnosis not present

## 2018-06-29 DIAGNOSIS — J00 Acute nasopharyngitis [common cold]: Secondary | ICD-10-CM | POA: Diagnosis not present

## 2018-07-07 DIAGNOSIS — M1A00X0 Idiopathic chronic gout, unspecified site, without tophus (tophi): Secondary | ICD-10-CM | POA: Diagnosis not present

## 2018-07-07 DIAGNOSIS — M0600 Rheumatoid arthritis without rheumatoid factor, unspecified site: Secondary | ICD-10-CM | POA: Diagnosis not present

## 2018-07-07 DIAGNOSIS — N183 Chronic kidney disease, stage 3 (moderate): Secondary | ICD-10-CM | POA: Diagnosis not present

## 2018-07-27 DIAGNOSIS — N183 Chronic kidney disease, stage 3 (moderate): Secondary | ICD-10-CM | POA: Diagnosis not present

## 2018-07-27 DIAGNOSIS — I10 Essential (primary) hypertension: Secondary | ICD-10-CM | POA: Diagnosis not present

## 2018-07-27 DIAGNOSIS — E7800 Pure hypercholesterolemia, unspecified: Secondary | ICD-10-CM | POA: Diagnosis not present

## 2018-07-27 DIAGNOSIS — I2510 Atherosclerotic heart disease of native coronary artery without angina pectoris: Secondary | ICD-10-CM | POA: Diagnosis not present

## 2018-08-01 DIAGNOSIS — Z79899 Other long term (current) drug therapy: Secondary | ICD-10-CM | POA: Diagnosis not present

## 2018-08-01 DIAGNOSIS — E669 Obesity, unspecified: Secondary | ICD-10-CM | POA: Diagnosis not present

## 2018-08-01 DIAGNOSIS — R946 Abnormal results of thyroid function studies: Secondary | ICD-10-CM | POA: Diagnosis not present

## 2018-08-01 DIAGNOSIS — N183 Chronic kidney disease, stage 3 (moderate): Secondary | ICD-10-CM | POA: Diagnosis not present

## 2018-08-01 DIAGNOSIS — I10 Essential (primary) hypertension: Secondary | ICD-10-CM | POA: Diagnosis not present

## 2018-08-01 DIAGNOSIS — E7800 Pure hypercholesterolemia, unspecified: Secondary | ICD-10-CM | POA: Diagnosis not present

## 2018-08-01 DIAGNOSIS — M1A00X0 Idiopathic chronic gout, unspecified site, without tophus (tophi): Secondary | ICD-10-CM | POA: Diagnosis not present

## 2018-08-10 ENCOUNTER — Other Ambulatory Visit: Payer: Self-pay

## 2018-08-10 ENCOUNTER — Encounter: Payer: Self-pay | Admitting: Emergency Medicine

## 2018-08-10 ENCOUNTER — Emergency Department: Payer: PPO

## 2018-08-10 ENCOUNTER — Inpatient Hospital Stay
Admission: EM | Admit: 2018-08-10 | Discharge: 2018-08-12 | DRG: 188 | Disposition: A | Discharge status: Home / Self Care | Payer: PPO | Attending: Internal Medicine | Admitting: Internal Medicine

## 2018-08-10 DIAGNOSIS — Z885 Allergy status to narcotic agent status: Secondary | ICD-10-CM | POA: Diagnosis not present

## 2018-08-10 DIAGNOSIS — Z8249 Family history of ischemic heart disease and other diseases of the circulatory system: Secondary | ICD-10-CM

## 2018-08-10 DIAGNOSIS — M1990 Unspecified osteoarthritis, unspecified site: Secondary | ICD-10-CM | POA: Diagnosis present

## 2018-08-10 DIAGNOSIS — M1A9XX0 Chronic gout, unspecified, without tophus (tophi): Secondary | ICD-10-CM | POA: Diagnosis present

## 2018-08-10 DIAGNOSIS — Z7982 Long term (current) use of aspirin: Secondary | ICD-10-CM | POA: Diagnosis not present

## 2018-08-10 DIAGNOSIS — N183 Chronic kidney disease, stage 3 (moderate): Secondary | ICD-10-CM | POA: Diagnosis present

## 2018-08-10 DIAGNOSIS — R0603 Acute respiratory distress: Secondary | ICD-10-CM | POA: Diagnosis not present

## 2018-08-10 DIAGNOSIS — Z87891 Personal history of nicotine dependence: Secondary | ICD-10-CM

## 2018-08-10 DIAGNOSIS — N189 Chronic kidney disease, unspecified: Secondary | ICD-10-CM | POA: Diagnosis not present

## 2018-08-10 DIAGNOSIS — Z79899 Other long term (current) drug therapy: Secondary | ICD-10-CM | POA: Diagnosis not present

## 2018-08-10 DIAGNOSIS — R490 Dysphonia: Secondary | ICD-10-CM | POA: Diagnosis present

## 2018-08-10 DIAGNOSIS — J040 Acute laryngitis: Secondary | ICD-10-CM | POA: Diagnosis present

## 2018-08-10 DIAGNOSIS — R0602 Shortness of breath: Secondary | ICD-10-CM

## 2018-08-10 DIAGNOSIS — M069 Rheumatoid arthritis, unspecified: Secondary | ICD-10-CM | POA: Diagnosis present

## 2018-08-10 DIAGNOSIS — R0600 Dyspnea, unspecified: Secondary | ICD-10-CM | POA: Diagnosis not present

## 2018-08-10 DIAGNOSIS — I2510 Atherosclerotic heart disease of native coronary artery without angina pectoris: Secondary | ICD-10-CM | POA: Diagnosis present

## 2018-08-10 DIAGNOSIS — I129 Hypertensive chronic kidney disease with stage 1 through stage 4 chronic kidney disease, or unspecified chronic kidney disease: Secondary | ICD-10-CM | POA: Diagnosis present

## 2018-08-10 DIAGNOSIS — Z9851 Tubal ligation status: Secondary | ICD-10-CM

## 2018-08-10 DIAGNOSIS — J90 Pleural effusion, not elsewhere classified: Principal | ICD-10-CM | POA: Diagnosis present

## 2018-08-10 DIAGNOSIS — R0902 Hypoxemia: Secondary | ICD-10-CM | POA: Diagnosis present

## 2018-08-10 DIAGNOSIS — I10 Essential (primary) hypertension: Secondary | ICD-10-CM | POA: Diagnosis not present

## 2018-08-10 DIAGNOSIS — E785 Hyperlipidemia, unspecified: Secondary | ICD-10-CM | POA: Diagnosis present

## 2018-08-10 DIAGNOSIS — Z9889 Other specified postprocedural states: Secondary | ICD-10-CM

## 2018-08-10 LAB — COMPREHENSIVE METABOLIC PANEL
ALK PHOS: 110 U/L (ref 38–126)
ALT: 18 U/L (ref 0–44)
AST: 23 U/L (ref 15–41)
Albumin: 3.2 g/dL — ABNORMAL LOW (ref 3.5–5.0)
Anion gap: 11 (ref 5–15)
BUN: 28 mg/dL — ABNORMAL HIGH (ref 8–23)
CALCIUM: 9 mg/dL (ref 8.9–10.3)
CO2: 19 mmol/L — ABNORMAL LOW (ref 22–32)
CREATININE: 1.28 mg/dL — AB (ref 0.44–1.00)
Chloride: 102 mmol/L (ref 98–111)
GFR, EST AFRICAN AMERICAN: 50 mL/min — AB (ref >60)
GFR, EST NON AFRICAN AMERICAN: 43 mL/min — AB (ref >60)
Glucose, Bld: 122 mg/dL — ABNORMAL HIGH (ref 70–99)
Potassium: 4.1 mmol/L (ref 3.5–5.1)
Sodium: 132 mmol/L — ABNORMAL LOW (ref 135–145)
TOTAL PROTEIN: 6.7 g/dL (ref 6.5–8.1)
Total Bilirubin: 0.7 mg/dL (ref 0.3–1.2)

## 2018-08-10 LAB — CBC WITH DIFFERENTIAL/PLATELET
Abs Immature Granulocytes: 0.04 10*3/uL (ref 0.00–0.07)
Basophils Absolute: 0 10*3/uL (ref 0.0–0.1)
Basophils Relative: 0 %
EOS PCT: 3 %
Eosinophils Absolute: 0.3 10*3/uL (ref 0.0–0.5)
HEMATOCRIT: 31.3 % — AB (ref 36.0–46.0)
Hemoglobin: 10.1 g/dL — ABNORMAL LOW (ref 12.0–15.0)
IMMATURE GRANULOCYTES: 0 %
LYMPHS ABS: 2.3 10*3/uL (ref 0.7–4.0)
Lymphocytes Relative: 25 %
MCH: 29.9 pg (ref 26.0–34.0)
MCHC: 32.3 g/dL (ref 30.0–36.0)
MCV: 92.6 fL (ref 80.0–100.0)
MONO ABS: 1.1 10*3/uL — AB (ref 0.1–1.0)
MONOS PCT: 12 %
Neutro Abs: 5.6 10*3/uL (ref 1.7–7.7)
Neutrophils Relative %: 60 %
Platelets: 316 10*3/uL (ref 150–400)
RBC: 3.38 MIL/uL — ABNORMAL LOW (ref 3.87–5.11)
RDW: 15 % (ref 11.5–15.5)
WBC: 9.4 10*3/uL (ref 4.0–10.5)
nRBC: 0 % (ref 0.0–0.2)

## 2018-08-10 LAB — TROPONIN I

## 2018-08-10 LAB — LIPASE, BLOOD: LIPASE: 40 U/L (ref 11–51)

## 2018-08-10 LAB — LACTIC ACID, PLASMA: Lactic Acid, Venous: 1 mmol/L (ref 0.5–1.9)

## 2018-08-10 NOTE — ED Provider Notes (Addendum)
[facility] Emergency Department Provider Note  ____________________________________________   First MD Initiated Contact with Patient 08/10/18 2316     (approximate)  I have reviewed the triage vital signs and the nursing notes.   HISTORY  Chief Complaint Shortness of Breath    HPI Pamela Young is a 68 y.o. female with medical history as listed below who notably denies any history of lung disease, heart disease, and cancer.  She presents by private vehicle for evaluation of gradually worsening shortness of breath over the last for 5 days.  She states that she has been fighting respiratory infection for the last month or so and she finished up some antibiotics.  She still has a little bit of laryngitis and hoarse voice but that has been improving significantly.  Over the last for 5 days she has been increasingly short of breath particularly with exertion and states that now she cannot walk across the room without becoming short of breath.  She has also had a persistent dry cough that is been bothering her.  the symptoms are severe and have been gradual in onset, made worse by exertion and lying flat and better with rest.  She denies any chest pain, body aches, nasal congestion, headache, fever/chills, nausea, vomiting, abdominal pain, and dysuria.  She has not had any night sweats but reports about a 65 pound weight loss over the last 6 months, although she states that she has been trying to lose weight but has been surprised at how much she has lost.  Past Medical History:  Diagnosis Date  . Arthritis   . Borderline abnormal TFTs 02/10/2018   Overview:  Borderline TSH/FT4, that have improved, but has high TPO antibodies.  . Chronic gouty arthritis 12/22/2017  . CKD (chronic kidney disease) stage 3, GFR 30-59 ml/min (HCC) 01/27/2017   Overview:  Overview:  Being followed by Dr. Holley Raring Overview:  Being followed by Dr. Holley Raring  . Coronary artery disease involving native  coronary artery of native heart 02/17/2017   Overview:  By ct scan   . Elevated erythrocyte sedimentation rate 12/10/2017  . Hyperlipemia   . Hyperlipidemia 05/31/2014  . Hypertension   . Hyperuricemia 12/10/2017  . 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 Renal disorder   . Secondary hyperparathyroidism ([place]) 07/21/2015   Overview:  Overview:  Followed by Dr. Holley Raring Overview:  Followed by Dr. Holley Raring  . Vitamin D deficiency 07/21/2015   Overview:  Overview:  Vit D 15 (checked thru nephrology, who is supplementing w/ ergocalciferol). Overview:  Vit D 15 (checked thru nephrology, who is supplementing w/ ergocalciferol).    Patient Active Problem List   Diagnosis Date Noted  . Pleural effusion 08/11/2018  . Respiratory distress 08/11/2018  . Borderline abnormal TFTs 02/10/2018  . 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 Chronic gouty arthritis 12/22/2017  . Hyperuricemia 12/10/2017  . Elevated erythrocyte sedimentation rate 12/10/2017  . Coronary artery disease involving native coronary artery of native heart 02/17/2017  . Chest pain 01/28/2017  . Obesity, Class III, BMI 40-49.9 (morbid obesity) ([place]) 01/27/2017  . CKD (chronic kidney disease) stage 3, GFR 30-59 ml/min (HCC) 01/27/2017  . Vitamin D deficiency 07/21/2015  . Secondary hyperparathyroidism ([place]) 07/21/2015  . Hypertension 05/31/2014  . Hyperlipidemia 05/31/2014    Past Surgical History:  Procedure Laterality Date  . TUBAL LIGATION      Prior to Admission medications   Medication Sig Start Date  End Date Taking? Authorizing Provider  allopurinol (ZYLOPRIM) 100 MG tablet TAKE 2 TABLETS BY MOUTH ONCE DAILY FOR 30 DAYS 01/18/18  Yes [provider]  amLODipine (NORVASC) 10 MG tablet Take 10 mg by mouth daily.   Yes [provider]  aspirin 81 MG chewable tablet Chew 81 mg by mouth daily.   Yes [provider]  atenolol (TENORMIN) 25 MG tablet Take 25 mg by  mouth daily.   Yes [provider]  atorvastatin (LIPITOR) 20 MG tablet Take 1 tablet by mouth daily. 11/17/17  Yes [provider]  hydrALAZINE (APRESOLINE) 100 MG tablet Take 1 tablet by mouth 3 (three) times daily. 11/17/17  Yes [provider]  hydroxychloroquine (PLAQUENIL) 200 MG tablet TAKE 1 TABLET BY MOUTH TWICE DAILY FOR 30 DAYS 02/04/18  Yes [provider]  losartan (COZAAR) 100 MG tablet Take 100 mg by mouth daily.   Yes [provider]  triamterene-hydrochlorothiazide (MAXZIDE) 75-50 MG tablet Take 1 tablet by mouth daily.   Yes [provider]    Allergies Codeine  Family History  Problem Relation Age of Onset  . Heart attack Mother   . Heart attack Father   . Hypertension Sister   . Hypertension Brother     Social History Social History   Tobacco Use  . Smoking status: Former Research scientist (life sciences)  . Smokeless tobacco: Never Used  Substance Use Topics  . Alcohol use: No    Frequency: Never  . Drug use: No    Review of Systems Constitutional: No fever/chills Eyes: No visual changes. ENT: No sore throat. Cardiovascular: Denies chest pain. Respiratory: Denies shortness of breath. Gastrointestinal: No abdominal pain.  No nausea, no vomiting.  No diarrhea.  No constipation. Genitourinary: Negative for dysuria. Musculoskeletal: Negative for neck pain.  Negative for back pain. Integumentary: Negative for rash. Neurological: Negative for headaches, focal weakness or numbness.   ____________________________________________   PHYSICAL EXAM:  VITAL SIGNS: ED Triage Vitals  Enc Vitals Group     BP 08/10/18 2231 (!) 153/59     Pulse Rate 08/10/18 2231 94     Resp 08/10/18 2231 (!) 33     Temp 08/10/18 2231 98.2 F (36.8 C)     Temp Source 08/10/18 2231 Oral     SpO2 08/10/18 2231 94 %     Weight 08/10/18 2225 103.9 kg (229 lb)     Height 08/10/18 2225 1.676 m (5\' 6" )     Head Circumference --      Peak Flow --       Pain Score 08/10/18 2225 8     Pain Loc --      Pain Edu? --      Excl. in [place]? --     Constitutional: Alert and oriented. Well appearing and in no acute distress. Eyes: Conjunctivae are normal.  Head: Atraumatic. Nose: No congestion/rhinnorhea. Mouth/Throat: Mucous membranes are moist. Neck: No stridor.  No meningeal signs.   Cardiovascular: Normal rate, regular rhythm. Good peripheral circulation. Grossly normal heart sounds. Respiratory: Normal respiratory effort.  Substantially decreased breath sounds all throughout the left side.  Normal breath sounds on the right.  No wheezing. Gastrointestinal: Soft and nontender. No distention.  Musculoskeletal: No lower extremity tenderness nor edema. No gross deformities of extremities. Neurologic:  Normal speech and language. No gross focal neurologic deficits are appreciated.  Skin:  Skin is warm, dry and intact. No rash noted. Psychiatric: Mood and affect are normal. Speech and behavior are normal.  ____________________________________________   LABS (all labs ordered are listed, but only abnormal results are displayed)  Labs Reviewed  COMPREHENSIVE METABOLIC PANEL - Abnormal; Notable for the following components:      Result Value   Sodium 132 (*)    CO2 19 (*)    Glucose, Bld 122 (*)    BUN 28 (*)    Creatinine, Ser 1.28 (*)    Albumin 3.2 (*)    GFR calc non Af Amer 43 (*)    GFR calc Af Amer 50 (*)    All other components within normal limits  CBC WITH DIFFERENTIAL/PLATELET - Abnormal; Notable for the following components:   RBC 3.38 (*)    Hemoglobin 10.1 (*)    HCT 31.3 (*)    Monocytes Absolute 1.1 (*)    All other components within normal limits  LIPASE, BLOOD  BRAIN NATRIURETIC PEPTIDE  TROPONIN I  LACTIC ACID, PLASMA  PROTIME-INR  INFLUENZA PANEL BY PCR (TYPE A & B)   ____________________________________________  EKG  ED ECG REPORT I, Hinda Kehr, the attending physician, personally viewed and interpreted  this ECG.  Date: 08/10/2018 EKG Time: 22: 32 Rate: 94 Rhythm: normal sinus rhythm QRS Axis: normal Intervals: normal ST/T Wave abnormalities: Non-specific ST segment / T-wave changes, but no clear evidence of acute ischemia. Narrative Interpretation: no definitive evidence of acute ischemia; does not meet STEMI criteria.   ____________________________________________  RADIOLOGY I, Hinda Kehr, personally viewed and evaluated these images (plain radiographs) as part of my medical decision making, as well as reviewing the written report by the radiologist.  ED MD interpretation: Chest x-ray demonstrates a large left pleural effusion and opacification of the left lung.  CT chest demonstrates large left pleural effusion that is obscuring a view of the lung parenchyma and making it difficult to either rule in or rule out neoplasm.  Official radiology report(s): Ct Chest W Contrast  Result Date: 08/11/2018 CLINICAL DATA:  68 year old female with left-sided pleural effusion concerning for neoplasm versus pneumonia. EXAM: CT CHEST WITH CONTRAST TECHNIQUE: Multidetector CT imaging of the chest was performed during intravenous contrast administration. CONTRAST:  14mL OMNIPAQUE IOHEXOL 300 MG/ML  SOLN COMPARISON:  Chest radiograph dated 08/10/2018 FINDINGS: Evaluation of this exam is limited due to respiratory motion artifact. Cardiovascular: Top-normal cardiac size. Coronary vascular calcification noted. No pericardial effusion. Mild atherosclerotic calcification of the thoracic aorta. The visualized origins of the great vessels of the aorta appear patent. The central pulmonary arteries are grossly unremarkable. Mediastinum/Nodes: No definite hilar adenopathy. Evaluation of the left hilum is however limited due to consolidative changes of the left lung and respiratory motion. The esophagus and the thyroid gland are grossly unremarkable. Lungs/Pleura: There is a large left pleural effusion with  compressive atelectasis of the majority of the left lower lobe and portion of the left upper lobe. There is fluid extending into the left major fissure. There is an area of apparent irregularity and nodularity anterior to the apex of the heart, possibly related to extension of pleural effusion. A neoplastic process is not excluded. No definite discrete mass identified, however evaluation is very limited due to large pleural effusion and atelectasis of the lung. Thoracentesis may provide diagnostic and therapeutic value. The right lung is clear. No pneumothorax. There is narrowing of the left upper and left lower lobe bronchi, likely related to mass effect. Upper Abdomen: There is a 12 mm hypodense lesion in the left lobe of the liver which was present on the CT of 11/23/2005,  most compatible with a benign or indolent process. The visualized upper abdomen is otherwise unremarkable. Musculoskeletal: Degenerative changes of the spine. No acute osseous pathology. IMPRESSION: 1. Large left pleural effusion with compressive atelectasis of the majority of the left lung. No discrete mass identified, however evaluation is very limited. Consider thoracentesis for further diagnosis as well as symptomatic relief. 2. Narrowing of the left upper and left lower lobe bronchi, likely related to mass effect. 3. Coronary vascular calcification. Electronically Signed   By: Anner Crete M.D.   On: 08/11/2018 00:41   Dg Chest Portable 1 View  Result Date: 08/10/2018 CLINICAL DATA:  68 y/o  F; shortness of breath. EXAM: PORTABLE CHEST 1 VIEW COMPARISON:  None. FINDINGS: Large left pleural effusion and opacification in the left mid and lower lung zones. Cardiac silhouette is obscured by the effusion. Aortic calcific atherosclerosis. Clear right lung. No acute osseous abnormality. IMPRESSION: Large left pleural effusion and opacification of the left mid and lower lung zones. Findings may represent underlying atelectasis, pneumonia,  or mass. Electronically Signed   By: Kristine Garbe M.D.   On: 08/10/2018 22:44    ____________________________________________   PROCEDURES  Critical Care performed: No   Procedure(s) performed:   Procedures   ____________________________________________   INITIAL IMPRESSION / ASSESSMENT AND PLAN / ED COURSE  As part of my medical decision making, I reviewed the following data within the [facility] notes reviewed and incorporated, Labs reviewed , EKG interpreted , Old chart reviewed, Radiograph reviewed , Discussed with admitting physician  and Notes from prior ED visits    Differential diagnosis includes, but is not limited to, lung cancer, respiratory virus, pneumonia, pulmonary embolism, heart failure, undiagnosed COPD.  The patient's chest x-ray shows near complete opacification a large pleural effusion of her left lung and her symptoms are consistent with a relatively rapid development of this pleural effusion.  However she reports a large weight loss over about 6 months and she has a history of smoking although she has not smoked in years.  I looked through her medical record and she had a PET scan a couple of years ago but she says it was not to evaluate for cancer and it was done because she was having some shortness of breath at that time to.  I cannot corroborate the reason for the PET scan with medical records available to me at this time.  Given her new diagnosis of the left pleural effusion and her symptoms (although she is in no acute distress at this time), I will proceed with a laboratory work-up and plan for a CT scan of her chest either with or without contrast depending on her kidney function.  I suspect she would benefit from admission and evaluation by pulmonology for thoracentesis given her severe dyspnea on exertion even though she does not require oxygen while she is at rest, but I will reassess and discuss it with the patient and  her family after the rest of the results are back.  Clinical Course as of Aug 11 299  Wed Aug 10, 2018  2355 Lactic Acid, Venous: 1.0 [CF]  Thu Aug 11, 2018  4174 Negative influenza panel.  Influenza panel by PCR (type A & B) [CF]  0045 Large pleural effusion is mostly obscuring the visualization of the structure of the lung, but mass is not excluded.  Will discuss with the hospitalist service for admission, pulmonology consultation and thoracentesis, etc.  Family agrees with the plan.  Hospitalist has been paged.  CT Chest W Contrast [CF]  0105 Discussed case by phone with Dr. Jannifer Franklin who will admit.   [CF]    Clinical Course User Index [CF] Hinda Kehr, MD    ____________________________________________  FINAL CLINICAL IMPRESSION(S) / ED DIAGNOSES  Final diagnoses:  Shortness of breath  Chronic kidney disease, unspecified CKD stage  Pleural effusion on left     MEDICATIONS GIVEN DURING THIS VISIT:  Medications  iohexol (OMNIPAQUE) 300 MG/ML solution 75 mL (60 mLs Intravenous Contrast Given 08/11/18 0017)     ED Discharge Orders    None       Note:  This document was prepared using Dragon voice recognition software and may include unintentional dictation errors.    Hinda Kehr, MD 08/11/18 8242    Hinda Kehr, MD 08/11/18 3536

## 2018-08-10 NOTE — ED Triage Notes (Signed)
Pt to triage via w/c with no distress noted; st since Saturday having prod cough clear sputum with SHOB on exertion; rx antibiotics recently for URI; c/o mid back as well

## 2018-08-11 ENCOUNTER — Inpatient Hospital Stay: Payer: PPO

## 2018-08-11 ENCOUNTER — Emergency Department: Payer: PPO

## 2018-08-11 ENCOUNTER — Other Ambulatory Visit: Payer: Self-pay

## 2018-08-11 ENCOUNTER — Encounter: Payer: Self-pay | Admitting: Radiology

## 2018-08-11 DIAGNOSIS — J040 Acute laryngitis: Secondary | ICD-10-CM | POA: Diagnosis present

## 2018-08-11 DIAGNOSIS — Z87891 Personal history of nicotine dependence: Secondary | ICD-10-CM | POA: Diagnosis not present

## 2018-08-11 DIAGNOSIS — M1A9XX0 Chronic gout, unspecified, without tophus (tophi): Secondary | ICD-10-CM | POA: Diagnosis present

## 2018-08-11 DIAGNOSIS — R0602 Shortness of breath: Secondary | ICD-10-CM | POA: Diagnosis present

## 2018-08-11 DIAGNOSIS — I2510 Atherosclerotic heart disease of native coronary artery without angina pectoris: Secondary | ICD-10-CM | POA: Diagnosis present

## 2018-08-11 DIAGNOSIS — I129 Hypertensive chronic kidney disease with stage 1 through stage 4 chronic kidney disease, or unspecified chronic kidney disease: Secondary | ICD-10-CM | POA: Diagnosis present

## 2018-08-11 DIAGNOSIS — M069 Rheumatoid arthritis, unspecified: Secondary | ICD-10-CM | POA: Diagnosis present

## 2018-08-11 DIAGNOSIS — J90 Pleural effusion, not elsewhere classified: Secondary | ICD-10-CM | POA: Diagnosis present

## 2018-08-11 DIAGNOSIS — N183 Chronic kidney disease, stage 3 (moderate): Secondary | ICD-10-CM | POA: Diagnosis present

## 2018-08-11 DIAGNOSIS — Z8249 Family history of ischemic heart disease and other diseases of the circulatory system: Secondary | ICD-10-CM | POA: Diagnosis not present

## 2018-08-11 DIAGNOSIS — E785 Hyperlipidemia, unspecified: Secondary | ICD-10-CM | POA: Diagnosis present

## 2018-08-11 DIAGNOSIS — R0603 Acute respiratory distress: Secondary | ICD-10-CM | POA: Diagnosis present

## 2018-08-11 DIAGNOSIS — Z7982 Long term (current) use of aspirin: Secondary | ICD-10-CM | POA: Diagnosis not present

## 2018-08-11 DIAGNOSIS — Z9851 Tubal ligation status: Secondary | ICD-10-CM | POA: Diagnosis not present

## 2018-08-11 DIAGNOSIS — M1990 Unspecified osteoarthritis, unspecified site: Secondary | ICD-10-CM | POA: Diagnosis present

## 2018-08-11 DIAGNOSIS — Z885 Allergy status to narcotic agent status: Secondary | ICD-10-CM | POA: Diagnosis not present

## 2018-08-11 DIAGNOSIS — R490 Dysphonia: Secondary | ICD-10-CM | POA: Diagnosis present

## 2018-08-11 DIAGNOSIS — R0902 Hypoxemia: Secondary | ICD-10-CM | POA: Diagnosis present

## 2018-08-11 DIAGNOSIS — Z79899 Other long term (current) drug therapy: Secondary | ICD-10-CM | POA: Diagnosis not present

## 2018-08-11 LAB — LACTATE DEHYDROGENASE, PLEURAL OR PERITONEAL FLUID: LD, Fluid: 233 U/L — ABNORMAL HIGH (ref 3–23)

## 2018-08-11 LAB — BODY FLUID CELL COUNT WITH DIFFERENTIAL
Eos, Fluid: 0 %
Lymphs, Fluid: 66 %
Monocyte-Macrophage-Serous Fluid: 3 %
Neutrophil Count, Fluid: 31 %
Other Cells, Fluid: 0 %
Total Nucleated Cell Count, Fluid: 864 cu mm

## 2018-08-11 LAB — INFLUENZA PANEL BY PCR (TYPE A & B)
Influenza A By PCR: NEGATIVE
Influenza B By PCR: NEGATIVE

## 2018-08-11 LAB — GLUCOSE, PLEURAL OR PERITONEAL FLUID: Glucose, Fluid: 103 mg/dL

## 2018-08-11 LAB — ALBUMIN, PLEURAL OR PERITONEAL FLUID: Albumin, Fluid: 2.4 g/dL

## 2018-08-11 LAB — PROTEIN, PLEURAL OR PERITONEAL FLUID: Total protein, fluid: 4 g/dL

## 2018-08-11 LAB — PROTIME-INR
INR: 1
Prothrombin Time: 13.1 seconds (ref 11.4–15.2)

## 2018-08-11 LAB — AMYLASE, PLEURAL OR PERITONEAL FLUID: Amylase, Fluid: 48 U/L

## 2018-08-11 LAB — TSH: TSH: 3.124 u[IU]/mL (ref 0.350–4.500)

## 2018-08-11 LAB — BRAIN NATRIURETIC PEPTIDE: B Natriuretic Peptide: 32 pg/mL (ref 0.0–100.0)

## 2018-08-11 MED ORDER — AMLODIPINE BESYLATE 10 MG PO TABS
10.0000 mg | ORAL_TABLET | Freq: Every day | ORAL | Status: DC
  Administered 2018-08-11 – 2018-08-12 (×2): 10 mg via ORAL
  Filled 2018-08-11 (×2): qty 1

## 2018-08-11 MED ORDER — ATENOLOL 25 MG PO TABS
25.0000 mg | ORAL_TABLET | Freq: Every day | ORAL | Status: DC
  Administered 2018-08-11 – 2018-08-12 (×2): 25 mg via ORAL
  Filled 2018-08-11 (×2): qty 1

## 2018-08-11 MED ORDER — HYDRALAZINE HCL 50 MG PO TABS
100.0000 mg | ORAL_TABLET | Freq: Three times a day (TID) | ORAL | Status: DC
  Administered 2018-08-11 – 2018-08-12 (×3): 100 mg via ORAL
  Filled 2018-08-11 (×4): qty 2

## 2018-08-11 MED ORDER — DOCUSATE SODIUM 100 MG PO CAPS
100.0000 mg | ORAL_CAPSULE | Freq: Two times a day (BID) | ORAL | Status: DC
  Administered 2018-08-11 – 2018-08-12 (×3): 100 mg via ORAL
  Filled 2018-08-11 (×3): qty 1

## 2018-08-11 MED ORDER — ACETAMINOPHEN 325 MG PO TABS: 650.0000 mg | ORAL_TABLET | Freq: Four times a day (QID) | ORAL | Status: DC | PRN

## 2018-08-11 MED ORDER — ONDANSETRON HCL 4 MG PO TABS: 4.0000 mg | ORAL_TABLET | Freq: Four times a day (QID) | ORAL | Status: DC | PRN

## 2018-08-11 MED ORDER — ONDANSETRON HCL 4 MG/2ML IJ SOLN: 4.0000 mg | Freq: Four times a day (QID) | INTRAMUSCULAR | Status: DC | PRN

## 2018-08-11 MED ORDER — IOHEXOL 300 MG/ML  SOLN
75.0000 mL | Freq: Once | INTRAMUSCULAR | Status: AC | PRN
  Administered 2018-08-11: 60 mL via INTRAVENOUS

## 2018-08-11 MED ORDER — ALLOPURINOL 100 MG PO TABS
100.0000 mg | ORAL_TABLET | Freq: Every day | ORAL | Status: DC
  Administered 2018-08-11 – 2018-08-12 (×2): 100 mg via ORAL
  Filled 2018-08-11 (×2): qty 1

## 2018-08-11 MED ORDER — TRIAMTERENE-HCTZ 75-50 MG PO TABS
1.0000 | ORAL_TABLET | Freq: Every day | ORAL | Status: DC
  Administered 2018-08-11: 1 via ORAL
  Filled 2018-08-11 (×2): qty 1

## 2018-08-11 MED ORDER — LOSARTAN POTASSIUM 50 MG PO TABS
100.0000 mg | ORAL_TABLET | Freq: Every day | ORAL | Status: DC
  Administered 2018-08-11 – 2018-08-12 (×2): 100 mg via ORAL
  Filled 2018-08-11 (×2): qty 2

## 2018-08-11 MED ORDER — ATORVASTATIN CALCIUM 20 MG PO TABS
20.0000 mg | ORAL_TABLET | Freq: Every day | ORAL | Status: DC
  Administered 2018-08-11 – 2018-08-12 (×2): 20 mg via ORAL
  Filled 2018-08-11 (×3): qty 1

## 2018-08-11 MED ORDER — ACETAMINOPHEN 650 MG RE SUPP: 650.0000 mg | Freq: Four times a day (QID) | RECTAL | Status: DC | PRN

## 2018-08-11 NOTE — ED Notes (Signed)
ED TO INPATIENT HANDOFF REPORT  Name/Age/Gender Pamela Young 68 y.o. female  Code Status   Home/SNF/Other Home  Chief Complaint SOB  Level of Care/Admitting Diagnosis ED Disposition    ED Disposition Condition [place]: [place] [[id number]]  Level of Care: Med-Surg [16]  Diagnosis: Respiratory distress [[id number]]  Admitting Physician: Harrie Foreman [[id number]]  Attending Physician: Harrie Foreman [[id number]]  Estimated length of stay: past midnight tomorrow  Certification:: I certify this patient will need inpatient services for at least 2 midnights  PT Class (Do Not Modify): Inpatient [101]  PT Acc Code (Do Not Modify): Private [1]       Medical History Past Medical History:  Diagnosis Date  . Arthritis   . Borderline abnormal TFTs 02/10/2018   Overview:  Borderline TSH/FT4, that have improved, but has high TPO antibodies.  . Chronic gouty arthritis 12/22/2017  . CKD (chronic kidney disease) stage 3, GFR 30-59 ml/min (HCC) 01/27/2017   Overview:  Overview:  Being followed by Dr. Holley Raring Overview:  Being followed by Dr. Holley Raring  . Coronary artery disease involving native coronary artery of native heart 02/17/2017   Overview:  By ct scan   . Elevated erythrocyte sedimentation rate 12/10/2017  . Hyperlipemia   . Hyperlipidemia 05/31/2014  . Hypertension   . Hyperuricemia 12/10/2017  . 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 Renal disorder   . Secondary hyperparathyroidism ([place]) 07/21/2015   Overview:  Overview:  Followed by Dr. Holley Raring Overview:  Followed by Dr. Holley Raring  . Vitamin D deficiency 07/21/2015   Overview:  Overview:  Vit D 15 (checked thru nephrology, who is supplementing w/ ergocalciferol). Overview:  Vit D 15 (checked thru nephrology, who is supplementing w/ ergocalciferol).    Allergies Allergies  Allergen Reactions  . Codeine     'makes me feel funny'    IV  Location/Drains/Wounds Patient Lines/Drains/Airways Status   Active Line/Drains/Airways    Name:   Placement date:   Placement time:   Site:   Days:   Peripheral IV 08/10/18 Left Forearm   08/10/18    2307    Forearm   1          Labs/Imaging Results for orders placed or performed during the hospital encounter of 08/10/18 (from the past 48 hour(s))  Comprehensive metabolic panel     Status: Abnormal   Collection Time: 08/10/18 11:13 PM  Result Value Ref Range   Sodium 132 (L) 135 - 145 mmol/L   Potassium 4.1 3.5 - 5.1 mmol/L   Chloride 102 98 - 111 mmol/L   CO2 19 (L) 22 - 32 mmol/L   Glucose, Bld 122 (H) 70 - 99 mg/dL   BUN 28 (H) 8 - 23 mg/dL   Creatinine, Ser 1.28 (H) 0.44 - 1.00 mg/dL   Calcium 9.0 8.9 - 10.3 mg/dL   Total Protein 6.7 6.5 - 8.1 g/dL   Albumin 3.2 (L) 3.5 - 5.0 g/dL   AST 23 15 - 41 U/L   ALT 18 0 - 44 U/L   Alkaline Phosphatase 110 38 - 126 U/L   Total Bilirubin 0.7 0.3 - 1.2 mg/dL   GFR calc non Af Amer 43 (L) >60 mL/min   GFR calc Af Amer 50 (L) >60 mL/min   Anion gap 11 5 - 15    Comment: Performed at [facility], [address] [place], [place]  [address]  Lipase, blood     Status: None   Collection Time: 08/10/18 11:13 PM  Result Value Ref Range   Lipase 40 11 - 51 U/L    Comment: Performed at [facility], [address] [place], [place] [address]  Brain natriuretic peptide     Status: None   Collection Time: 08/10/18 11:13 PM  Result Value Ref Range   B Natriuretic Peptide 32.0 0.0 - 100.0 pg/mL    Comment: Performed at [facility], [address] [place], [place] [address]  Troponin I - Once     Status: None   Collection Time: 08/10/18 11:13 PM  Result Value Ref Range   Troponin I <0.03 <0.03 ng/mL    Comment: Performed at [facility], [address] [place], [place] [address]  Lactic acid, plasma     Status: None   Collection Time: 08/10/18 11:13 PM  Result Value Ref Range   Lactic Acid,  Venous 1.0 0.5 - 1.9 mmol/L    Comment: Performed at [facility], [address] [place], [place] [address]  CBC with Differential     Status: Abnormal   Collection Time: 08/10/18 11:13 PM  Result Value Ref Range   WBC 9.4 4.0 - 10.5 K/uL   RBC 3.38 (L) 3.87 - 5.11 MIL/uL   Hemoglobin 10.1 (L) 12.0 - 15.0 g/dL   HCT 31.3 (L) 36.0 - 46.0 %   MCV 92.6 80.0 - 100.0 fL   MCH 29.9 26.0 - 34.0 pg   MCHC 32.3 30.0 - 36.0 g/dL   RDW 15.0 11.5 - 15.5 %   Platelets 316 150 - 400 K/uL   nRBC 0.0 0.0 - 0.2 %   Neutrophils Relative % 60 %   Neutro Abs 5.6 1.7 - 7.7 K/uL   Lymphocytes Relative 25 %   Lymphs Abs 2.3 0.7 - 4.0 K/uL   Monocytes Relative 12 %   Monocytes Absolute 1.1 (H) 0.1 - 1.0 K/uL   Eosinophils Relative 3 %   Eosinophils Absolute 0.3 0.0 - 0.5 K/uL   Basophils Relative 0 %   Basophils Absolute 0.0 0.0 - 0.1 K/uL   Immature Granulocytes 0 %   Abs Immature Granulocytes 0.04 0.00 - 0.07 K/uL    Comment: Performed at [facility], [address] [place], [place] [address]  Protime-INR     Status: None   Collection Time: 08/10/18 11:40 PM  Result Value Ref Range   Prothrombin Time 13.1 11.4 - 15.2 seconds   INR 1.00     Comment: Performed at [facility], [address] [place], [place] [address]  Influenza panel by PCR (type A & B)     Status: None   Collection Time: 08/10/18 11:40 PM  Result Value Ref Range   Influenza A By PCR NEGATIVE NEGATIVE   Influenza B By PCR NEGATIVE NEGATIVE    Comment: (NOTE) The Xpert Xpress Flu assay is intended as an aid in the diagnosis of  influenza and should not be used as a sole basis for treatment.  This  assay is FDA approved for nasopharyngeal swab specimens only. Nasal  washings and aspirates are unacceptable for Xpert Xpress Flu testing. Performed at [facility], [address] [place], [place] [address]    Ct Chest W Contrast  Result Date: 08/11/2018 CLINICAL DATA:  68 year old  female with left-sided pleural effusion concerning for neoplasm versus pneumonia. EXAM: CT CHEST WITH CONTRAST TECHNIQUE: Multidetector CT imaging of  the chest was performed during intravenous contrast administration. CONTRAST:  25mL OMNIPAQUE IOHEXOL 300 MG/ML  SOLN COMPARISON:  Chest radiograph dated 08/10/2018 FINDINGS: Evaluation of this exam is limited due to respiratory motion artifact. Cardiovascular: Top-normal cardiac size. Coronary vascular calcification noted. No pericardial effusion. Mild atherosclerotic calcification of the thoracic aorta. The visualized origins of the great vessels of the aorta appear patent. The central pulmonary arteries are grossly unremarkable. Mediastinum/Nodes: No definite hilar adenopathy. Evaluation of the left hilum is however limited due to consolidative changes of the left lung and respiratory motion. The esophagus and the thyroid gland are grossly unremarkable. Lungs/Pleura: There is a large left pleural effusion with compressive atelectasis of the majority of the left lower lobe and portion of the left upper lobe. There is fluid extending into the left major fissure. There is an area of apparent irregularity and nodularity anterior to the apex of the heart, possibly related to extension of pleural effusion. A neoplastic process is not excluded. No definite discrete mass identified, however evaluation is very limited due to large pleural effusion and atelectasis of the lung. Thoracentesis may provide diagnostic and therapeutic value. The right lung is clear. No pneumothorax. There is narrowing of the left upper and left lower lobe bronchi, likely related to mass effect. Upper Abdomen: There is a 12 mm hypodense lesion in the left lobe of the liver which was present on the CT of 11/23/2005, most compatible with a benign or indolent process. The visualized upper abdomen is otherwise unremarkable. Musculoskeletal: Degenerative changes of the spine. No acute osseous pathology.  IMPRESSION: 1. Large left pleural effusion with compressive atelectasis of the majority of the left lung. No discrete mass identified, however evaluation is very limited. Consider thoracentesis for further diagnosis as well as symptomatic relief. 2. Narrowing of the left upper and left lower lobe bronchi, likely related to mass effect. 3. Coronary vascular calcification. Electronically Signed   By: Anner Crete M.D.   On: 08/11/2018 00:41   Dg Chest Portable 1 View  Result Date: 08/10/2018 CLINICAL DATA:  68 y/o  F; shortness of breath. EXAM: PORTABLE CHEST 1 VIEW COMPARISON:  None. FINDINGS: Large left pleural effusion and opacification in the left mid and lower lung zones. Cardiac silhouette is obscured by the effusion. Aortic calcific atherosclerosis. Clear right lung. No acute osseous abnormality. IMPRESSION: Large left pleural effusion and opacification of the left mid and lower lung zones. Findings may represent underlying atelectasis, pneumonia, or mass. Electronically Signed   By: Kristine Garbe M.D.   On: 08/10/2018 22:44    Pending Labs Unresulted Labs (From admission, onward)    Start     Ordered   Signed and Held  TSH  Add-on,   R     Signed and Held          Vitals/Pain Today's Vitals   08/10/18 2231 08/11/18 0000 08/11/18 0030 08/11/18 0230  BP: (!) 153/59 137/60 (!) 141/66 (!) 152/73  Pulse: 94 87 91 92  Resp: (!) 33   (!) 28  Temp: 98.2 F (36.8 C)     TempSrc: Oral     SpO2: 94% 95% 93% 95%  Weight:      Height:      PainSc: 0-No pain       Isolation Precautions No active isolations  Medications Medications  iohexol (OMNIPAQUE) 300 MG/ML solution 75 mL (60 mLs Intravenous Contrast Given 08/11/18 0017)    Mobility walks

## 2018-08-11 NOTE — ED Notes (Signed)
Pt placed on 2L [place] at this time.

## 2018-08-11 NOTE — H&P (Signed)
Pamela Young is an 68 y.o. female.   Chief Complaint: Shortness of breath HPI: Patient with past medical history of CKD, hypertension, hyperlipidemia, gout and rheumatoid arthritis presents to the emergency department complaining of shortness of breath.  The patient has had gradually worsening dyspnea and cough over the last week.  She states that she fatigues easily but denies chest pain.  Her cough is nonproductive.  She reports repeated upper respiratory infections and just finished antibiotics for laryngitis or sinusitis.  Chest x-ray in the emergency department showed a white out of her left lung.  CT scan of her chest was unable to distinguish significant detail through the effusion and possible consolidation that is concerning for malignancy.  The patient was placed on oxygen via nasal cannula prior to the emergency department staff called the hospitalist service for admission.  Past Medical History:  Diagnosis Date  . Arthritis   . Borderline abnormal TFTs 02/10/2018   Overview:  Borderline TSH/FT4, that have improved, but has high TPO antibodies.  . Chronic gouty arthritis 12/22/2017  . CKD (chronic kidney disease) stage 3, GFR 30-59 ml/min (HCC) 01/27/2017   Overview:  Overview:  Being followed by Dr. Holley Raring Overview:  Being followed by Dr. Holley Raring  . Coronary artery disease involving native coronary artery of native heart 02/17/2017   Overview:  By ct scan   . Elevated erythrocyte sedimentation rate 12/10/2017  . Hyperlipemia   . Hyperlipidemia 05/31/2014  . Hypertension   . Hyperuricemia 12/10/2017  . 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 Renal disorder   . Secondary hyperparathyroidism ([place]) 07/21/2015   Overview:  Overview:  Followed by Dr. Holley Raring Overview:  Followed by Dr. Holley Raring  . Vitamin D deficiency 07/21/2015   Overview:  Overview:  Vit D 15 (checked thru nephrology, who is supplementing w/ ergocalciferol). Overview:  Vit D 15 (checked  thru nephrology, who is supplementing w/ ergocalciferol).    Past Surgical History:  Procedure Laterality Date  . TUBAL LIGATION      Family History  Problem Relation Age of Onset  . Heart attack Mother   . Heart attack Father   . Hypertension Sister   . Hypertension Brother    Social History:  reports that she has quit smoking. She has never used smokeless tobacco. She reports that she does not drink alcohol or use drugs.  Allergies:  Allergies  Allergen Reactions  . Codeine     'makes me feel funny'    Medications Prior to Admission  Medication Sig Dispense Refill  . allopurinol (ZYLOPRIM) 100 MG tablet TAKE 2 TABLETS BY MOUTH ONCE DAILY FOR 30 DAYS  2  . amLODipine (NORVASC) 10 MG tablet Take 10 mg by mouth daily.    Marland Kitchen aspirin 81 MG chewable tablet Chew 81 mg by mouth daily.    Marland Kitchen atenolol (TENORMIN) 25 MG tablet Take 25 mg by mouth daily.    Marland Kitchen atorvastatin (LIPITOR) 20 MG tablet Take 1 tablet by mouth daily.  4  . hydrALAZINE (APRESOLINE) 100 MG tablet Take 1 tablet by mouth 3 (three) times daily.  12  . hydroxychloroquine (PLAQUENIL) 200 MG tablet TAKE 1 TABLET BY MOUTH TWICE DAILY FOR 30 DAYS  1  . losartan (COZAAR) 100 MG tablet Take 100 mg by mouth daily.    Marland Kitchen triamterene-hydrochlorothiazide (MAXZIDE) 75-50 MG tablet Take 1 tablet by mouth daily.      Results for orders placed or performed during  the hospital encounter of 08/10/18 (from the past 48 hour(s))  Comprehensive metabolic panel     Status: Abnormal   Collection Time: 08/10/18 11:13 PM  Result Value Ref Range   Sodium 132 (L) 135 - 145 mmol/L   Potassium 4.1 3.5 - 5.1 mmol/L   Chloride 102 98 - 111 mmol/L   CO2 19 (L) 22 - 32 mmol/L   Glucose, Bld 122 (H) 70 - 99 mg/dL   BUN 28 (H) 8 - 23 mg/dL   Creatinine, Ser 1.28 (H) 0.44 - 1.00 mg/dL   Calcium 9.0 8.9 - 10.3 mg/dL   Total Protein 6.7 6.5 - 8.1 g/dL   Albumin 3.2 (L) 3.5 - 5.0 g/dL   AST 23 15 - 41 U/L   ALT 18 0 - 44 U/L   Alkaline Phosphatase  110 38 - 126 U/L   Total Bilirubin 0.7 0.3 - 1.2 mg/dL   GFR calc non Af Amer 43 (L) >60 mL/min   GFR calc Af Amer 50 (L) >60 mL/min   Anion gap 11 5 - 15    Comment: Performed at [facility], [address] [place], [place] [address]  Lipase, blood     Status: None   Collection Time: 08/10/18 11:13 PM  Result Value Ref Range   Lipase 40 11 - 51 U/L    Comment: Performed at [facility], [address] [place], [place] [address]  Brain natriuretic peptide     Status: None   Collection Time: 08/10/18 11:13 PM  Result Value Ref Range   B Natriuretic Peptide 32.0 0.0 - 100.0 pg/mL    Comment: Performed at [facility], [address] [place], [place] [address]  Troponin I - Once     Status: None   Collection Time: 08/10/18 11:13 PM  Result Value Ref Range   Troponin I <0.03 <0.03 ng/mL    Comment: Performed at [facility], [address] [place], [place] [address]  Lactic acid, plasma     Status: None   Collection Time: 08/10/18 11:13 PM  Result Value Ref Range   Lactic Acid, Venous 1.0 0.5 - 1.9 mmol/L    Comment: Performed at [facility], [address] [place], [place] [address]  CBC with Differential     Status: Abnormal   Collection Time: 08/10/18 11:13 PM  Result Value Ref Range   WBC 9.4 4.0 - 10.5 K/uL   RBC 3.38 (L) 3.87 - 5.11 MIL/uL   Hemoglobin 10.1 (L) 12.0 - 15.0 g/dL   HCT 31.3 (L) 36.0 - 46.0 %   MCV 92.6 80.0 - 100.0 fL   MCH 29.9 26.0 - 34.0 pg   MCHC 32.3 30.0 - 36.0 g/dL   RDW 15.0 11.5 - 15.5 %   Platelets 316 150 - 400 K/uL   nRBC 0.0 0.0 - 0.2 %   Neutrophils Relative % 60 %   Neutro Abs 5.6 1.7 - 7.7 K/uL   Lymphocytes Relative 25 %   Lymphs Abs 2.3 0.7 - 4.0 K/uL   Monocytes Relative 12 %   Monocytes Absolute 1.1 (H) 0.1 - 1.0 K/uL   Eosinophils Relative 3 %   Eosinophils Absolute 0.3 0.0 - 0.5 K/uL   Basophils Relative 0 %   Basophils Absolute 0.0 0.0 - 0.1 K/uL   Immature Granulocytes 0  %   Abs Immature Granulocytes 0.04 0.00 - 0.07 K/uL    Comment: Performed at [facility], [address],  [place], [place] [address]  Protime-INR     Status: None   Collection Time: 08/10/18 11:40 PM  Result Value Ref Range   Prothrombin Time 13.1 11.4 - 15.2 seconds   INR 1.00     Comment: Performed at [facility], [address] [place], [place] [address]  Influenza panel by PCR (type A & B)     Status: None   Collection Time: 08/10/18 11:40 PM  Result Value Ref Range   Influenza A By PCR NEGATIVE NEGATIVE   Influenza B By PCR NEGATIVE NEGATIVE    Comment: (NOTE) The Xpert Xpress Flu assay is intended as an aid in the diagnosis of  influenza and should not be used as a sole basis for treatment.  This  assay is FDA approved for nasopharyngeal swab specimens only. Nasal  washings and aspirates are unacceptable for Xpert Xpress Flu testing. Performed at [facility], [address] [place],  [address]    Ct Chest W Contrast  Result Date: 08/11/2018 CLINICAL DATA:  68 year old female with left-sided pleural effusion concerning for neoplasm versus pneumonia. EXAM: CT CHEST WITH CONTRAST TECHNIQUE: Multidetector CT imaging of the chest was performed during intravenous contrast administration. CONTRAST:  69mL OMNIPAQUE IOHEXOL 300 MG/ML  SOLN COMPARISON:  Chest radiograph dated 08/10/2018 FINDINGS: Evaluation of this exam is limited due to respiratory motion artifact. Cardiovascular: Top-normal cardiac size. Coronary vascular calcification noted. No pericardial effusion. Mild atherosclerotic calcification of the thoracic aorta. The visualized origins of the great vessels of the aorta appear patent. The central pulmonary arteries are grossly unremarkable. Mediastinum/Nodes: No definite hilar adenopathy. Evaluation of the left hilum is however limited due to consolidative changes of the left lung and respiratory motion. The esophagus and the thyroid gland  are grossly unremarkable. Lungs/Pleura: There is a large left pleural effusion with compressive atelectasis of the majority of the left lower lobe and portion of the left upper lobe. There is fluid extending into the left major fissure. There is an area of apparent irregularity and nodularity anterior to the apex of the heart, possibly related to extension of pleural effusion. A neoplastic process is not excluded. No definite discrete mass identified, however evaluation is very limited due to large pleural effusion and atelectasis of the lung. Thoracentesis may provide diagnostic and therapeutic value. The right lung is clear. No pneumothorax. There is narrowing of the left upper and left lower lobe bronchi, likely related to mass effect. Upper Abdomen: There is a 12 mm hypodense lesion in the left lobe of the liver which was present on the CT of 11/23/2005, most compatible with a benign or indolent process. The visualized upper abdomen is otherwise unremarkable. Musculoskeletal: Degenerative changes of the spine. No acute osseous pathology. IMPRESSION: 1. Large left pleural effusion with compressive atelectasis of the majority of the left lung. No discrete mass identified, however evaluation is very limited. Consider thoracentesis for further diagnosis as well as symptomatic relief. 2. Narrowing of the left upper and left lower lobe bronchi, likely related to mass effect. 3. Coronary vascular calcification. Electronically Signed   By: Anner Crete M.D.   On: 08/11/2018 00:41   Dg Chest Portable 1 View  Result Date: 08/10/2018 CLINICAL DATA:  68 y/o  F; shortness of breath. EXAM: PORTABLE CHEST 1 VIEW COMPARISON:  None. FINDINGS: Large left pleural effusion and opacification in the left mid and lower lung zones. Cardiac silhouette is obscured by the effusion. Aortic calcific atherosclerosis. Clear right lung. No acute osseous abnormality. IMPRESSION:  Large left pleural effusion and opacification of the left  mid and lower lung zones. Findings may represent underlying atelectasis, pneumonia, or mass. Electronically Signed   By: Kristine Garbe M.D.   On: 08/10/2018 22:44    Review of Systems  Constitutional: Negative for chills and fever.  HENT: Negative for sore throat and tinnitus.   Eyes: Negative for blurred vision and redness.  Respiratory: Positive for cough and shortness of breath.   Cardiovascular: Negative for chest pain, palpitations, orthopnea and PND.  Gastrointestinal: Negative for abdominal pain, diarrhea, nausea and vomiting.  Genitourinary: Negative for dysuria, frequency and urgency.  Musculoskeletal: Negative for joint pain and myalgias.  Skin: Negative for rash.       No lesions  Neurological: Negative for speech change, focal weakness and weakness.  Endo/Heme/Allergies: Does not bruise/bleed easily.       No temperature intolerance  Psychiatric/Behavioral: Negative for depression and suicidal ideas.    Blood pressure (!) 166/70, pulse 98, temperature 98 F (36.7 C), temperature source Oral, resp. rate (!) 22, height 5\' 6"  (1.676 m), weight 104.2 kg, SpO2 95 %. Physical Exam  Vitals reviewed. Constitutional: She is oriented to person, place, and time. She appears well-developed and well-nourished. No distress.  HENT:  Head: Normocephalic and atraumatic.  Mouth/Throat: Oropharynx is clear and moist.  Eyes: Pupils are equal, round, and reactive to light. Conjunctivae and EOM are normal. No scleral icterus.  Neck: Normal range of motion. Neck supple. No JVD present. No tracheal deviation present. No thyromegaly present.  Cardiovascular: Normal rate, regular rhythm and normal heart sounds. Exam reveals no gallop and no friction rub.  No murmur heard. Respiratory: Effort normal. She has decreased breath sounds in the left lower field.  GI: Soft. Bowel sounds are normal. She exhibits no distension. There is no abdominal tenderness.  Genitourinary:    Genitourinary  Comments: Deferred   Musculoskeletal: Normal range of motion.        General: No edema.  Lymphadenopathy:    She has no cervical adenopathy.  Neurological: She is alert and oriented to person, place, and time. No cranial nerve deficit. She exhibits normal muscle tone.  Skin: Skin is warm and dry. No rash noted. No erythema.  Psychiatric: She has a normal mood and affect. Her behavior is normal. Judgment and thought content normal.     Assessment/Plan This is a 68 year old female admitted for respiratory distress. 1.  Respiratory distress: Secondary to large left-sided pleural effusion.  Currently no signs or symptoms of sepsis.  Respiratory status is improved and stable.  Continue supplemental oxygen as needed. Plan for ultrasound-guided thoracentesis.  Analyze fluid for infection and cytology.  Concerning for malignancy. 2.  Hypertension: Intermittently controlled; continue amlodipine, atenolol, hydralazine, Maxide and losartan. 3.  Hyperlipidemia: Continue statin therapy 4.  Rheumatoid arthritis: RF negative, CCP positive.  Continue Plaquenil. 5.  CKD: Stage III; improved over baseline.  Or nephrotoxic agents. 6.  DVT prophylaxis: SCDs for now.  Resume pharmacologic prophylaxis following thoracentesis 7.  GI prophylaxis: None The patient is a full code.  Time spent on admission orders and patient care approximately 45 minutes  Harrie Foreman, MD 08/11/2018, 5:48 AM

## 2018-08-11 NOTE — Procedures (Signed)
Interventional Radiology Procedure:   Indications: Large left pleural effusion with SOB.  Procedure: US thoracentesis  Findings: Large amount of pleural fluid on [place].  Removed 1.6 liters of yellow fluid and stopped procedure due to coughing. Residual pleural fluid remaining at end.   Complications: None     EBL: Minimal  Plan: CXR   Pamela Young R. Anselm Pancoast, MD  Pager: [phone]

## 2018-08-11 NOTE — Progress Notes (Signed)
Pt returns from thoracentesis. VSS, no complaints of pain. Band aide to left back is clean, dry and intact. Pt reports feeling better. I will continue to assess.

## 2018-08-11 NOTE — Progress Notes (Signed)
Admitted this morning for acute distress and found to have left pleural effusion status post thoracentesis, 1.3 L of fluid removed.  Sent for cytology, cultures.  Follow results, patient not on antibiotics at this time.  Labs reviewed.  Continue oxygen, patient O2 sats 97% on 2 L.

## 2018-08-11 NOTE — ED Notes (Signed)
Pt given water at this time. OK per MD.

## 2018-08-12 ENCOUNTER — Inpatient Hospital Stay
Admit: 2018-08-12 | Discharge: 2018-08-12 | Disposition: A | Discharge status: Home / Self Care | Payer: PPO | Attending: Internal Medicine | Admitting: Internal Medicine

## 2018-08-12 LAB — TRIGLYCERIDES, BODY FLUIDS: Triglycerides, Fluid: 23 mg/dL

## 2018-08-12 LAB — PH, BODY FLUID: pH, Body Fluid: 7.5

## 2018-08-12 LAB — ECHOCARDIOGRAM COMPLETE
Height: 66 in
Weight: 3619.2 oz

## 2018-08-12 LAB — CYTOLOGY - NON PAP

## 2018-08-12 MED ORDER — TRIAMTERENE-HCTZ 37.5-25 MG PO TABS
2.0000 | ORAL_TABLET | Freq: Every day | ORAL | Status: DC
  Administered 2018-08-12: 2 via ORAL
  Filled 2018-08-12: qty 2

## 2018-08-12 NOTE — Progress Notes (Signed)
Advanced care plan.  Purpose of the Encounter: CODE STATUS  Parties in Attendance: Patient  Patient's Decision Capacity: Good  Subjective/Patient's story: Presented to the emergency room for shortness of breath   Objective/Medical story Has large left pleural effusion needs thoracentesis and evaluation   Goals of care determination:  Advance care directives goals of care and treatment plan discussed Patient wants everything done which includes CPR, intubation ventilator the need arises   CODE STATUS: Full code   Time spent discussing advanced care planning: 16 minutes

## 2018-08-12 NOTE — Discharge Summary (Signed)
[facility] [place] NAME: Pamela Young    MR#:  [id number]  DATE OF BIRTH:  29-Oct-1950  DATE OF ADMISSION:  08/10/2018 ADMITTING PHYSICIAN: Harrie Foreman, MD  DATE OF DISCHARGE: 08/12/2018  PRIMARY CARE PHYSICIAN: Sallee Lange, NP   ADMISSION DIAGNOSIS:  Shortness of breath [R06.02] Pleural effusion on left [J90] Chronic kidney disease, unspecified CKD stage [N18.9]  DISCHARGE DIAGNOSIS:  Acute respiratory distress Hypoxia secondary pleural effusion Left lung pleural effusion Chronic kidney disease stage III  SECONDARY DIAGNOSIS:   Past Medical History:  Diagnosis Date  . Arthritis   . Borderline abnormal TFTs 02/10/2018   Overview:  Borderline TSH/FT4, that have improved, but has high TPO antibodies.  . Chronic gouty arthritis 12/22/2017  . CKD (chronic kidney disease) stage 3, GFR 30-59 ml/min (HCC) 01/27/2017   Overview:  Overview:  Being followed by Dr. Holley Raring Overview:  Being followed by Dr. Holley Raring  . Coronary artery disease involving native coronary artery of native heart 02/17/2017   Overview:  By ct scan   . Elevated erythrocyte sedimentation rate 12/10/2017  . Hyperlipemia   . Hyperlipidemia 05/31/2014  . Hypertension   . Hyperuricemia 12/10/2017  . 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 Renal disorder   . Secondary hyperparathyroidism ([place]) 07/21/2015   Overview:  Overview:  Followed by Dr. Holley Raring Overview:  Followed by Dr. Holley Raring  . Vitamin D deficiency 07/21/2015   Overview:  Overview:  Vit D 15 (checked thru nephrology, who is supplementing w/ ergocalciferol). Overview:  Vit D 15 (checked thru nephrology, who is supplementing w/ ergocalciferol).     ADMITTING HISTORY Patient with past medical history of CKD, hypertension, hyperlipidemia, gout and rheumatoid arthritis presents to the emergency department complaining of shortness of breath.  The patient has had  gradually worsening dyspnea and cough over the last week.  She states that she fatigues easily but denies chest pain.  Her cough is nonproductive.  She reports repeated upper respiratory infections and just finished antibiotics for laryngitis or sinusitis.  Chest x-ray in the emergency department showed a white out of her left lung.  CT scan of her chest was unable to distinguish significant detail through the effusion and possible consolidation that is concerning for malignancy.  The patient was placed on oxygen via nasal cannula prior to the emergency department staff called the hospitalist service for admission.  HOSPITAL COURSE:  Patient was admitted to medical floor.  She was put on oxygen via nasal cannula worked up with CT chest which showed pleural effusion in the left lung.  She underwent ultrasound-guided thoracentesis.  1.6 L of pleural fluid was removed.  Pleural fluid was sent for testing.  Pleural fluid analysis was reviewed.  Shortness of breath resolved patient has been weaned off oxygen.  Appears transudative probably secondary to rheumatoid arthritis.  Patient was also worked up with echocardiogram.  Patient will be discharged home with outpatient follow-up with pulmonary attending Dr. Ashby Dawes.    CONSULTS OBTAINED:    DRUG ALLERGIES:   Allergies  Allergen Reactions  . Codeine     'makes me feel funny'    DISCHARGE MEDICATIONS:   Allergies as of 08/12/2018      Reactions   Codeine    'makes me feel funny'      Medication List    TAKE these medications   allopurinol 100 MG tablet Commonly known as:  ZYLOPRIM TAKE 2 TABLETS BY MOUTH ONCE DAILY FOR 30 DAYS   amLODipine 10 MG tablet Commonly known as:  NORVASC Take 10 mg by mouth daily.   aspirin 81 MG chewable tablet Chew 81 mg by mouth daily.   atenolol 25 MG tablet Commonly known as:  TENORMIN Take 25 mg by mouth daily.   atorvastatin 20 MG tablet Commonly known as:  LIPITOR Take 1 tablet by mouth  daily.   hydrALAZINE 100 MG tablet Commonly known as:  APRESOLINE Take 1 tablet by mouth 3 (three) times daily.   hydroxychloroquine 200 MG tablet Commonly known as:  PLAQUENIL TAKE 1 TABLET BY MOUTH TWICE DAILY FOR 30 DAYS   losartan 100 MG tablet Commonly known as:  COZAAR Take 100 mg by mouth daily.   triamterene-hydrochlorothiazide 75-50 MG tablet Commonly known as:  MAXZIDE Take 1 tablet by mouth daily.       Today  Patient seen and evaluated today No shortness of breath Completely weaned off oxygen Successfully tolerated thoracentesis Hemodynamically stable Will be discharged home VITAL SIGNS:  Blood pressure 136/71, pulse 81, temperature 98.7 F (37.1 C), temperature source Oral, resp. rate 17, height 5\' 6"  (1.676 m), weight 102.6 kg, SpO2 95 %.  I/O:    Intake/Output Summary (Last 24 hours) at 08/12/2018 1530 Last data filed at 08/12/2018 0956 Gross per 24 hour  Intake 480 ml  Output 1500 ml  Net -1020 ml    PHYSICAL EXAMINATION:  Physical Exam  GENERAL:  68 y.o.-year-old patient lying in the bed with no acute distress.  LUNGS: Normal breath sounds bilaterally, no wheezing, rales,rhonchi or crepitation. No use of accessory muscles of respiration.  CARDIOVASCULAR: S1, S2 normal. No murmurs, rubs, or gallops.  ABDOMEN: Soft, non-tender, non-distended. Bowel sounds present. No organomegaly or mass.  NEUROLOGIC: Moves all 4 extremities. PSYCHIATRIC: The patient is alert and oriented x 3.  SKIN: No obvious rash, lesion, or ulcer.   DATA REVIEW:   CBC Recent Labs  Lab 08/10/18 2313  WBC 9.4  HGB 10.1*  HCT 31.3*  PLT 316    Chemistries  Recent Labs  Lab 08/10/18 2313  NA 132*  K 4.1  CL 102  CO2 19*  GLUCOSE 122*  BUN 28*  CREATININE 1.28*  CALCIUM 9.0  AST 23  ALT 18  ALKPHOS 110  BILITOT 0.7    Cardiac Enzymes Recent Labs  Lab 08/10/18 2313  TROPONINI <0.03    Microbiology Results  Results for orders placed or performed  during the hospital encounter of 08/10/18  Body fluid culture     Status: None (Preliminary result)   Collection Time: 08/11/18  9:56 AM  Result Value Ref Range Status   Specimen Description   Final    PLEURAL Performed at [facility], [address], [place], [place] [address]    Special Requests   Final    NONE Performed at [facility], [address] [place], [place] [address]    Gram Stain   Final    WBC PRESENT,BOTH PMN AND MONONUCLEAR NO ORGANISMS SEEN CYTOSPIN SMEAR    Culture   Final    NO GROWTH < 24 HOURS Performed at [facility], [place] [address] [place],  [address]    Report Status PENDING  Incomplete    RADIOLOGY:  Ct Chest W Contrast  Result Date: 08/11/2018 CLINICAL DATA:  68 year old female with left-sided pleural effusion concerning for neoplasm versus pneumonia. EXAM: CT CHEST WITH CONTRAST TECHNIQUE: Multidetector  CT imaging of the chest was performed during intravenous contrast administration. CONTRAST:  66mL OMNIPAQUE IOHEXOL 300 MG/ML  SOLN COMPARISON:  Chest radiograph dated 08/10/2018 FINDINGS: Evaluation of this exam is limited due to respiratory motion artifact. Cardiovascular: Top-normal cardiac size. Coronary vascular calcification noted. No pericardial effusion. Mild atherosclerotic calcification of the thoracic aorta. The visualized origins of the great vessels of the aorta appear patent. The central pulmonary arteries are grossly unremarkable. Mediastinum/Nodes: No definite hilar adenopathy. Evaluation of the left hilum is however limited due to consolidative changes of the left lung and respiratory motion. The esophagus and the thyroid gland are grossly unremarkable. Lungs/Pleura: There is a large left pleural effusion with compressive atelectasis of the majority of the left lower lobe and portion of the left upper lobe. There is fluid extending into the left major fissure. There is an area of apparent irregularity and  nodularity anterior to the apex of the heart, possibly related to extension of pleural effusion. A neoplastic process is not excluded. No definite discrete mass identified, however evaluation is very limited due to large pleural effusion and atelectasis of the lung. Thoracentesis may provide diagnostic and therapeutic value. The right lung is clear. No pneumothorax. There is narrowing of the left upper and left lower lobe bronchi, likely related to mass effect. Upper Abdomen: There is a 12 mm hypodense lesion in the left lobe of the liver which was present on the CT of 11/23/2005, most compatible with a benign or indolent process. The visualized upper abdomen is otherwise unremarkable. Musculoskeletal: Degenerative changes of the spine. No acute osseous pathology. IMPRESSION: 1. Large left pleural effusion with compressive atelectasis of the majority of the left lung. No discrete mass identified, however evaluation is very limited. Consider thoracentesis for further diagnosis as well as symptomatic relief. 2. Narrowing of the left upper and left lower lobe bronchi, likely related to mass effect. 3. Coronary vascular calcification. Electronically Signed   By: Anner Crete M.D.   On: 08/11/2018 00:41   Dg Chest Port 1 View  Result Date: 08/11/2018 CLINICAL DATA:  Status post thoracentesis. EXAM: PORTABLE CHEST 1 VIEW COMPARISON:  Chest radiograph 08/10/2018 and chest CT 08/11/2018 FINDINGS: The left heart border remains obscured. Aortic atherosclerosis is noted. There is a moderate residual left pleural effusion, decreased from prior with improved aeration of the left midlung. There is persistent atelectasis in the left lower lung. The right lung remains clear. No pneumothorax is identified. IMPRESSION: Decreased size of left pleural effusion following thoracentesis. No pneumothorax. Electronically Signed   By: Logan Bores M.D.   On: 08/11/2018 10:08   Dg Chest Portable 1 View  Result Date:  08/10/2018 CLINICAL DATA:  68 y/o  F; shortness of breath. EXAM: PORTABLE CHEST 1 VIEW COMPARISON:  None. FINDINGS: Large left pleural effusion and opacification in the left mid and lower lung zones. Cardiac silhouette is obscured by the effusion. Aortic calcific atherosclerosis. Clear right lung. No acute osseous abnormality. IMPRESSION: Large left pleural effusion and opacification of the left mid and lower lung zones. Findings may represent underlying atelectasis, pneumonia, or mass. Electronically Signed   By: Kristine Garbe M.D.   On: 08/10/2018 22:44   US Thoracentesis Asp Pleural Space W/img Guide  Result Date: 08/11/2018 INDICATION: Shortness of breath and large left pleural effusion. EXAM: ULTRASOUND GUIDED LEFT THORACENTESIS MEDICATIONS: None. COMPLICATIONS: None immediate. PROCEDURE: An ultrasound guided thoracentesis was thoroughly discussed with the patient and questions answered. The benefits, risks, alternatives and complications were also discussed. The  patient understands and wishes to proceed with the procedure. Written consent was obtained. Ultrasound was performed to localize and mark an adequate pocket of fluid in the left chest. The area was then prepped and draped in the normal sterile fashion. 1% Lidocaine was used for local anesthesia. Under ultrasound guidance a 6 Fr Safe-T-Centesis catheter was introduced. Thoracentesis was performed. Procedure was stopped when the patient started coughing. The catheter was removed and a dressing applied. FINDINGS: A total of approximately 1.6 L of yellow fluid was removed. Samples were sent to the laboratory as requested by the clinical team. Large amount of residual pleural fluid. IMPRESSION: Successful ultrasound guided left thoracentesis yielding 1.6 L of pleural fluid. Large amount of residual pleural fluid at the end of the procedure. Electronically Signed   By: Markus Daft M.D.   On: 08/11/2018 10:13    Follow up with PCP in 1  week.  Management plans discussed with the patient, family and they are in agreement.  CODE STATUS: Full code    Code Status Orders  (From admission, onward)         Start     Ordered   08/11/18 0356  Full code  Continuous     08/11/18 0355        Code Status History    This patient has a current code status but no historical code status.    Advance Directive Documentation     Most Recent Value  Type of Advance Directive  Living will  Pre-existing out of facility DNR order (yellow form or pink MOST form)  -  "MOST" Form in Place?  -      TOTAL TIME TAKING CARE OF THIS PATIENT ON DAY OF DISCHARGE: more than 35 minutes.   Saundra Shelling M.D on 08/12/2018 at 3:30 PM  Between 7am to 6pm - Pager - [phone]  After 6pm go to [email or web address] - password [email or web address] [facility]  Office  [phone]  CC: Primary care physician; Sallee Lange, NP  Note: This dictation was prepared with Dragon dictation along with smaller phrase technology. Any transcriptional errors that result from this process are unintentional.

## 2018-08-12 NOTE — Progress Notes (Signed)
*  PRELIMINARY RESULTS* Echocardiogram 2D Echocardiogram has been performed.  Sherrie Sport 08/12/2018, 3:05 PM

## 2018-08-12 NOTE — Progress Notes (Signed)
[facility] at [place] NAME: Karilyn Wind    MR#:  [id number]  DATE OF BIRTH:  August 05, 1950  SUBJECTIVE:  CHIEF COMPLAINT:   Chief Complaint  Patient presents with  . Shortness of Breath  Patient seen and evaluated today No shortness of breath Comfortably breathing on room air No chest pain Patient had 1.6 L of fluid removed by thoracentesis  REVIEW OF SYSTEMS:    ROS  CONSTITUTIONAL: No documented fever. No fatigue, weakness. No weight gain, no weight loss.  EYES: No blurry or double vision.  ENT: No tinnitus. No postnasal drip. No redness of the oropharynx.  RESPIRATORY: No cough, no wheeze, no hemoptysis. No dyspnea.  CARDIOVASCULAR: No chest pain. No orthopnea. No palpitations. No syncope.  GASTROINTESTINAL: No nausea, no vomiting or diarrhea. No abdominal pain. No melena or hematochezia.  GENITOURINARY: No dysuria or hematuria.  ENDOCRINE: No polyuria or nocturia. No heat or cold intolerance.  HEMATOLOGY: No anemia. No bruising. No bleeding.  INTEGUMENTARY: No rashes. No lesions.  MUSCULOSKELETAL: No arthritis. No swelling. No gout.  NEUROLOGIC: No numbness, tingling, or ataxia. No seizure-type activity.  PSYCHIATRIC: No anxiety. No insomnia. No ADD.   DRUG ALLERGIES:   Allergies  Allergen Reactions  . Codeine     'makes me feel funny'    VITALS:  Blood pressure 136/71, pulse 81, temperature 98.7 F (37.1 C), temperature source Oral, resp. rate 17, height 5\' 6"  (1.676 m), weight 102.6 kg, SpO2 95 %.  PHYSICAL EXAMINATION:   Physical Exam  GENERAL:  68 y.o.-year-old patient lying in the bed with no acute distress.  EYES: Pupils equal, round, reactive to light and accommodation. No scleral icterus. Extraocular muscles intact.  HEENT: Head atraumatic, normocephalic. Oropharynx and nasopharynx clear.  NECK:  Supple, no jugular venous distention. No thyroid enlargement, no tenderness.  LUNGS: Improved breath sounds  bilaterally, decreased Rales. No use of accessory muscles of respiration.  CARDIOVASCULAR: S1, S2 normal. No murmurs, rubs, or gallops.  ABDOMEN: Soft, nontender, nondistended. Bowel sounds present. No organomegaly or mass.  EXTREMITIES: No cyanosis, clubbing or edema b/l.    NEUROLOGIC: Cranial nerves II through XII are intact. No focal Motor or sensory deficits b/l.   PSYCHIATRIC: The patient is alert and oriented x 3.  SKIN: No obvious rash, lesion, or ulcer.   LABORATORY PANEL:   CBC Recent Labs  Lab 08/10/18 2313  WBC 9.4  HGB 10.1*  HCT 31.3*  PLT 316   ------------------------------------------------------------------------------------------------------------------ Chemistries  Recent Labs  Lab 08/10/18 2313  NA 132*  K 4.1  CL 102  CO2 19*  GLUCOSE 122*  BUN 28*  CREATININE 1.28*  CALCIUM 9.0  AST 23  ALT 18  ALKPHOS 110  BILITOT 0.7   ------------------------------------------------------------------------------------------------------------------  Cardiac Enzymes Recent Labs  Lab 08/10/18 2313  TROPONINI <0.03   ------------------------------------------------------------------------------------------------------------------  RADIOLOGY:  Ct Chest W Contrast  Result Date: 08/11/2018 CLINICAL DATA:  68 year old female with left-sided pleural effusion concerning for neoplasm versus pneumonia. EXAM: CT CHEST WITH CONTRAST TECHNIQUE: Multidetector CT imaging of the chest was performed during intravenous contrast administration. CONTRAST:  5mL OMNIPAQUE IOHEXOL 300 MG/ML  SOLN COMPARISON:  Chest radiograph dated 08/10/2018 FINDINGS: Evaluation of this exam is limited due to respiratory motion artifact. Cardiovascular: Top-normal cardiac size. Coronary vascular calcification noted. No pericardial effusion. Mild atherosclerotic calcification of the thoracic aorta. The visualized origins of the great vessels of the aorta appear patent. The central pulmonary  arteries are grossly unremarkable. Mediastinum/Nodes: No  definite hilar adenopathy. Evaluation of the left hilum is however limited due to consolidative changes of the left lung and respiratory motion. The esophagus and the thyroid gland are grossly unremarkable. Lungs/Pleura: There is a large left pleural effusion with compressive atelectasis of the majority of the left lower lobe and portion of the left upper lobe. There is fluid extending into the left major fissure. There is an area of apparent irregularity and nodularity anterior to the apex of the heart, possibly related to extension of pleural effusion. A neoplastic process is not excluded. No definite discrete mass identified, however evaluation is very limited due to large pleural effusion and atelectasis of the lung. Thoracentesis may provide diagnostic and therapeutic value. The right lung is clear. No pneumothorax. There is narrowing of the left upper and left lower lobe bronchi, likely related to mass effect. Upper Abdomen: There is a 12 mm hypodense lesion in the left lobe of the liver which was present on the CT of 11/23/2005, most compatible with a benign or indolent process. The visualized upper abdomen is otherwise unremarkable. Musculoskeletal: Degenerative changes of the spine. No acute osseous pathology. IMPRESSION: 1. Large left pleural effusion with compressive atelectasis of the majority of the left lung. No discrete mass identified, however evaluation is very limited. Consider thoracentesis for further diagnosis as well as symptomatic relief. 2. Narrowing of the left upper and left lower lobe bronchi, likely related to mass effect. 3. Coronary vascular calcification. Electronically Signed   By: Anner Crete M.D.   On: 08/11/2018 00:41   Dg Chest Port 1 View  Result Date: 08/11/2018 CLINICAL DATA:  Status post thoracentesis. EXAM: PORTABLE CHEST 1 VIEW COMPARISON:  Chest radiograph 08/10/2018 and chest CT 08/11/2018 FINDINGS: The left  heart border remains obscured. Aortic atherosclerosis is noted. There is a moderate residual left pleural effusion, decreased from prior with improved aeration of the left midlung. There is persistent atelectasis in the left lower lung. The right lung remains clear. No pneumothorax is identified. IMPRESSION: Decreased size of left pleural effusion following thoracentesis. No pneumothorax. Electronically Signed   By: Logan Bores M.D.   On: 08/11/2018 10:08   Dg Chest Portable 1 View  Result Date: 08/10/2018 CLINICAL DATA:  68 y/o  F; shortness of breath. EXAM: PORTABLE CHEST 1 VIEW COMPARISON:  None. FINDINGS: Large left pleural effusion and opacification in the left mid and lower lung zones. Cardiac silhouette is obscured by the effusion. Aortic calcific atherosclerosis. Clear right lung. No acute osseous abnormality. IMPRESSION: Large left pleural effusion and opacification of the left mid and lower lung zones. Findings may represent underlying atelectasis, pneumonia, or mass. Electronically Signed   By: Kristine Garbe M.D.   On: 08/10/2018 22:44   US Thoracentesis Asp Pleural Space W/img Guide  Result Date: 08/11/2018 INDICATION: Shortness of breath and large left pleural effusion. EXAM: ULTRASOUND GUIDED LEFT THORACENTESIS MEDICATIONS: None. COMPLICATIONS: None immediate. PROCEDURE: An ultrasound guided thoracentesis was thoroughly discussed with the patient and questions answered. The benefits, risks, alternatives and complications were also discussed. The patient understands and wishes to proceed with the procedure. Written consent was obtained. Ultrasound was performed to localize and mark an adequate pocket of fluid in the left chest. The area was then prepped and draped in the normal sterile fashion. 1% Lidocaine was used for local anesthesia. Under ultrasound guidance a 6 Fr Safe-T-Centesis catheter was introduced. Thoracentesis was performed. Procedure was stopped when the patient  started coughing. The catheter was removed and a dressing applied.  FINDINGS: A total of approximately 1.6 L of yellow fluid was removed. Samples were sent to the laboratory as requested by the clinical team. Large amount of residual pleural fluid. IMPRESSION: Successful ultrasound guided left thoracentesis yielding 1.6 L of pleural fluid. Large amount of residual pleural fluid at the end of the procedure. Electronically Signed   By: Markus Daft M.D.   On: 08/11/2018 10:13     ASSESSMENT AND PLAN:  68 year old female patient with history of chronic kidney disease stage III, hyperlipidemia, hypertension, secondary hyperparathyroidism, vitamin D deficiency presented to the emergency room for shortness of breath currently under hospitalist service for pleural effusion  -Large left pleural effusion Status post thoracentesis 1.6 L of fluid removed Follow-up pleural fluid cytology, cultures and analysis Repeat chest x-ray showed decreased pleural effusion Echocardiogram to assess LV function  -Hypertension Continue amlodipine, beta-blocker and hydralazine  -Hyperlipidemia Continue statin therapy  -CKD stage III Monitor renal function   All the records are reviewed and case discussed with Care Management/Social Worker. Management plans discussed with the patient, family and they are in agreement.  CODE STATUS: Full code  DVT Prophylaxis: SCDs  TOTAL TIME TAKING CARE OF THIS PATIENT: 35 minutes.   POSSIBLE D/C IN 1 to 2 DAYS, DEPENDING ON CLINICAL CONDITION.  Saundra Shelling M.D on 08/12/2018 at 11:53 AM  Between 7am to 6pm - Pager - [phone]  After 6pm go to [email or web address] - password [email or web address] [facility]  Office  [phone]  CC: Primary care physician; Sallee Lange, NP  Note: This dictation was prepared with Dragon dictation along with smaller phrase technology. Any transcriptional errors that result from this process are unintentional.

## 2018-08-14 LAB — BODY FLUID CULTURE: Culture: NO GROWTH

## 2018-08-17 DIAGNOSIS — Z09 Encounter for follow-up examination after completed treatment for conditions other than malignant neoplasm: Secondary | ICD-10-CM | POA: Diagnosis not present

## 2018-08-17 DIAGNOSIS — I10 Essential (primary) hypertension: Secondary | ICD-10-CM | POA: Diagnosis not present

## 2018-08-17 DIAGNOSIS — J90 Pleural effusion, not elsewhere classified: Secondary | ICD-10-CM | POA: Diagnosis not present

## 2018-08-17 DIAGNOSIS — Z79899 Other long term (current) drug therapy: Secondary | ICD-10-CM | POA: Diagnosis not present

## 2018-08-17 DIAGNOSIS — N183 Chronic kidney disease, stage 3 (moderate): Secondary | ICD-10-CM | POA: Diagnosis not present

## 2018-08-17 DIAGNOSIS — M0600 Rheumatoid arthritis without rheumatoid factor, unspecified site: Secondary | ICD-10-CM | POA: Diagnosis not present

## 2018-08-17 DIAGNOSIS — R0982 Postnasal drip: Secondary | ICD-10-CM | POA: Diagnosis not present

## 2018-08-20 DIAGNOSIS — J90 Pleural effusion, not elsewhere classified: Secondary | ICD-10-CM

## 2018-08-20 HISTORY — DX: Pleural effusion, not elsewhere classified: J90

## 2018-08-20 HISTORY — PX: THORACENTESIS: SHX235

## 2018-08-22 DIAGNOSIS — R399 Unspecified symptoms and signs involving the genitourinary system: Secondary | ICD-10-CM | POA: Diagnosis not present

## 2018-08-22 DIAGNOSIS — N390 Urinary tract infection, site not specified: Secondary | ICD-10-CM | POA: Diagnosis not present

## 2018-08-22 DIAGNOSIS — R310 Gross hematuria: Secondary | ICD-10-CM | POA: Diagnosis not present

## 2018-08-23 DIAGNOSIS — M0600 Rheumatoid arthritis without rheumatoid factor, unspecified site: Secondary | ICD-10-CM | POA: Diagnosis not present

## 2018-08-23 DIAGNOSIS — M1A00X0 Idiopathic chronic gout, unspecified site, without tophus (tophi): Secondary | ICD-10-CM | POA: Diagnosis not present

## 2018-08-23 DIAGNOSIS — N183 Chronic kidney disease, stage 3 (moderate): Secondary | ICD-10-CM | POA: Diagnosis not present

## 2018-08-23 DIAGNOSIS — Z79899 Other long term (current) drug therapy: Secondary | ICD-10-CM | POA: Diagnosis not present

## 2018-08-26 ENCOUNTER — Encounter: Payer: Self-pay | Admitting: Internal Medicine

## 2018-08-26 ENCOUNTER — Ambulatory Visit: Payer: PPO | Admitting: Internal Medicine

## 2018-08-26 DIAGNOSIS — J9811 Atelectasis: Secondary | ICD-10-CM | POA: Diagnosis not present

## 2018-08-26 DIAGNOSIS — J90 Pleural effusion, not elsewhere classified: Secondary | ICD-10-CM

## 2018-08-26 NOTE — Progress Notes (Signed)
[facility] Consultation      Assessment and Plan:  Recurrent left pleural effusion. Pneumonia. - Patient has an undiagnosed atypical left pleural effusion.  This appears to persist/reaccumulating. - We will repeat thoracentesis followed immediately by CT chest to look for evidence of lung mass.  Based on these results we will decide on further work-up.   Date: 08/26/2018  MRN# [id number] Pamela Young 1950-11-25  Referring Physician: Dr. Estanislado Pandy for pleural effusion.   Pamela Young is a 68 y.o. old female seen in consultation for chief complaint of:    Chief Complaint  Patient presents with  . Hospitalization Follow-up    Recently seen in ED for SOB. States since ED she has noticed a significant improvement. States she has a nasal drainage and she is still having alot of phlegm.     HPI:   The patient is a 68 year old female with a recent admission to the hospital with dyspnea.  She was found to have a large left pleural effusion, with near complete left lung atelectasis.  She was admitted to the hospital on 08/10/2018, underwent thoracentesis and was discharged on 1/24.  Review of thoracentesis cytology results shows no malignancy, only mixed inflammation with background eosinophilia.  Patient is followed at [facility] rheumatology for seronegative rheumatoid arthritis, chronic gout.  Patient also has a history of essential hypertension  Around that time she had been having bronchitis and cough. She had never had this problem in the past. She normally feels that her breathing is "fine". She is not very active, she lives with her husband at home she lives independently, she drives a car. She can walk a supermaket and can carry the groceries in the house. She does not have dyspnea with usual daily activity.  She did not have issues with cough in the past. No previous respiratory diagnosis.  She last smoked about 11 years ago, she smoked about a ppd, she smoked for about 15-18  years.  She has no inside pets, denies reflux, occasional sinus drainage.  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  She has been trying to lose weight and has lost >60 pounds.  She denies cough, no hemoptysis.  No previous diagnosis of sleep apnea. She is sleepy during the day, she snores at night.   **Thoracentesis 08/11/2018>> fluid appears exudative.  Predominantly lymphocytic, cytology negative, culture negative. **Chest x-ray 1/22, CT chest 08/11/2018>> there is a large left-sided pleural effusion, there is a possible mass within atelectatic lung, however it is obscured. **Echocardiogram 01/28/2017>> Ultrasound enhancing agent utilized to improve endocardial border  definition  Left ventricular hypertrophy - mild  Normal left ventricular systolic function, ejection fraction 60 to 93%  Diastolic dysfunction - grade I (normal filling pressures)  Aortic sclerosis  Normal right ventricular systolic function  Mildly elevated right atrial pressure   PMHX:   Past Medical History:  Diagnosis Date  . Arthritis   . Borderline abnormal TFTs 02/10/2018   Overview:  Borderline TSH/FT4, that have improved, but has high TPO antibodies.  . Chronic gouty arthritis 12/22/2017  . CKD (chronic kidney disease) stage 3, GFR 30-59 ml/min (HCC) 01/27/2017   Overview:  Overview:  Being followed by Dr. Holley Raring Overview:  Being followed by Dr. Holley Raring  . Coronary artery disease involving native coronary artery of native heart 02/17/2017   Overview:  By ct scan   . Elevated erythrocyte sedimentation rate 12/10/2017  . Hyperlipemia   .  Hyperlipidemia 05/31/2014  . Hypertension   . Hyperuricemia 12/10/2017  . 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 Renal disorder   . Secondary hyperparathyroidism ([place]) 07/21/2015   Overview:  Overview:  Followed by Dr. Holley Raring Overview:  Followed by Dr. Holley Raring  . Vitamin D deficiency  07/21/2015   Overview:  Overview:  Vit D 15 (checked thru nephrology, who is supplementing w/ ergocalciferol). Overview:  Vit D 15 (checked thru nephrology, who is supplementing w/ ergocalciferol).   Surgical Hx:  Past Surgical History:  Procedure Laterality Date  . TUBAL LIGATION     Family Hx:  Family History  Problem Relation Age of Onset  . Heart attack Mother   . Heart attack Father   . Hypertension Sister   . Hypertension Brother    Social Hx:   Social History   Tobacco Use  . Smoking status: Former Research scientist (life sciences)  . Smokeless tobacco: Never Used  Substance Use Topics  . Alcohol use: No    Frequency: Never  . Drug use: No   Medication:    Current Outpatient Medications:  .  allopurinol (ZYLOPRIM) 100 MG tablet, TAKE 2 TABLETS BY MOUTH ONCE DAILY FOR 30 DAYS, Disp: , Rfl: 2 .  amLODipine (NORVASC) 10 MG tablet, Take 10 mg by mouth daily., Disp: , Rfl:  .  aspirin 81 MG chewable tablet, Chew 81 mg by mouth daily., Disp: , Rfl:  .  atenolol (TENORMIN) 25 MG tablet, Take 25 mg by mouth daily., Disp: , Rfl:  .  atorvastatin (LIPITOR) 20 MG tablet, Take 1 tablet by mouth daily., Disp: , Rfl: 4 .  hydrALAZINE (APRESOLINE) 100 MG tablet, Take 1 tablet by mouth 3 (three) times daily., Disp: , Rfl: 12 .  hydroxychloroquine (PLAQUENIL) 200 MG tablet, TAKE 1 TABLET BY MOUTH TWICE DAILY FOR 30 DAYS, Disp: , Rfl: 1 .  losartan (COZAAR) 100 MG tablet, Take 100 mg by mouth daily., Disp: , Rfl:  .  triamterene-hydrochlorothiazide (MAXZIDE) 75-50 MG tablet, Take 1 tablet by mouth daily., Disp: , Rfl:    Allergies:  Codeine  Review of Systems: Gen:  Denies  fever, sweats, chills HEENT: Denies blurred vision, double vision. bleeds, sore throat Cvc:  No dizziness, chest pain. Resp:   Denies cough or sputum production, shortness of breath Gi: Denies swallowing difficulty, stomach pain. Gu:  Denies bladder incontinence, burning urine Ext:   No Joint pain, stiffness. Skin: No skin rash,   hives  Endoc:  No polyuria, polydipsia. Psych: No depression, insomnia. Other:  All other systems were reviewed with the patient and were negative other that what is mentioned in the HPI.   Physical Examination:   VS: BP (!) 150/70   Pulse 85   Ht 5\' 6"  (1.676 m)   Wt 225 lb 3.2 oz (102.2 kg)   SpO2 98%   BMI 36.35 kg/m   General Appearance: No distress  Neuro:without focal findings,  speech normal,  HEENT: PERRLA, EOM intact.   Pulmonary: normal breath sounds, No wheezing.  CardiovascularNormal S1,S2.  No m/r/g.   Abdomen: Benign, Soft, non-tender. Renal:  No costovertebral tenderness  GU:  No performed at this time. Endoc: No evident thyromegaly, no signs of acromegaly. Skin:   warm, no rashes, no ecchymosis  Extremities: normal, no cyanosis, clubbing.  Other findings:    LABORATORY PANEL:   CBC No results for input(s): WBC, HGB, HCT, PLT in the last 168 hours. ------------------------------------------------------------------------------------------------------------------  Chemistries  No results for input(s): NA, K, CL, CO2, GLUCOSE, BUN, CREATININE, CALCIUM, MG, AST, ALT, ALKPHOS, BILITOT in the last 168 hours.  Invalid input(s): GFRCGP ------------------------------------------------------------------------------------------------------------------  Cardiac Enzymes No results for input(s): TROPONINI in the last 168 hours. ------------------------------------------------------------  RADIOLOGY:  No results found.     Thank  you for the consultation and for allowing [facility] to assist in the care of your patient. Our recommendations are noted above.  Please contact us if we can be of further service.   Marda Stalker, M.D., F.C.C.P.  Board Certified in Internal Medicine, Pulmonary Medicine, [place], and Sleep Medicine.   Pulmonary and Critical Care Office Number: [phone]   08/26/2018

## 2018-08-26 NOTE — Patient Instructions (Signed)
We will plan for repeat drainage of fluid next week, followed immediately by CAT scan.  We will see you the following week to discuss further planning.

## 2018-08-30 ENCOUNTER — Ambulatory Visit: Payer: PPO

## 2018-08-30 ENCOUNTER — Ambulatory Visit
Admission: RE | Admit: 2018-08-30 | Discharge: 2018-08-30 | Disposition: A | Discharge status: Home / Self Care | Payer: PPO | Source: Ambulatory Visit | Attending: Internal Medicine | Admitting: Internal Medicine

## 2018-08-30 ENCOUNTER — Ambulatory Visit (HOSPITAL_COMMUNITY): Payer: PPO

## 2018-08-30 DIAGNOSIS — I700 Atherosclerosis of aorta: Secondary | ICD-10-CM | POA: Diagnosis not present

## 2018-08-30 DIAGNOSIS — J9811 Atelectasis: Secondary | ICD-10-CM

## 2018-08-30 DIAGNOSIS — J90 Pleural effusion, not elsewhere classified: Secondary | ICD-10-CM | POA: Insufficient documentation

## 2018-08-30 LAB — LACTATE DEHYDROGENASE, PLEURAL OR PERITONEAL FLUID: LD, Fluid: 94 U/L — ABNORMAL HIGH (ref 3–23)

## 2018-08-30 LAB — PROTEIN, PLEURAL OR PERITONEAL FLUID: Total protein, fluid: 3.6 g/dL

## 2018-08-30 LAB — BODY FLUID CELL COUNT WITH DIFFERENTIAL
Eos, Fluid: 0 %
Lymphs, Fluid: 50 %
Monocyte-Macrophage-Serous Fluid: 14 %
Neutrophil Count, Fluid: 36 %
Other Cells, Fluid: 0 %
Total Nucleated Cell Count, Fluid: 738 cu mm

## 2018-08-30 LAB — GLUCOSE, PLEURAL OR PERITONEAL FLUID: Glucose, Fluid: 112 mg/dL

## 2018-08-30 MED ORDER — LACTATED RINGERS IV SOLN
INTRAVENOUS | Status: DC
  Administered 2018-08-30: 13:00:00 via INTRAVENOUS

## 2018-08-30 NOTE — Progress Notes (Signed)
Patient was taken to the CT scan department via wheelchair with family per Dr. Renette Butters instruction.  Patient is aware of need for ct scan of chest and then also with verbal understanding that she is to follow up as directed, appointment made before discharged home.

## 2018-08-30 NOTE — Discharge Instructions (Signed)
Thoracentesis, Care After  This sheet gives you information about how to care for yourself after your procedure. Your health care provider may also give you more specific instructions. If you have problems or questions, contact your health care provider.  What can I expect after the procedure?  After your procedure, it is common to have some pain at the site where the needle was inserted (puncture site).  Follow these instructions at home:    Care of the puncture site   Follow instructions from your health care provider about how to take care of your puncture site. Make sure you:  ? Wash your hands with soap and water before you change your bandage (dressing). If soap and water are not available, use hand sanitizer.  ? Change your dressing as told by your health care provider.   Check the puncture site every day for signs of infection. Check for:  ? Redness, swelling, or pain.  ? Fluid or blood.  ? Warmth.  ? Pus or a bad smell.   Do not take baths, swim, or use a hot tub until your health care provider approves.  General instructions   Take over-the-counter and prescription medicines only as told by your health care provider.   Do not drive for 24 hours if you were given a medicine to help you relax (sedative) during your procedure.   Drink enough fluid to keep your urine pale yellow.   You may return to your normal diet and normal activities as told by your health care provider.   Keep all follow-up visits as told by your health care provider. This is important.  Contact a health care provider if you:   Have redness, swelling, or pain at your puncture site.   Have fluid or blood coming from your puncture site.   Notice that your puncture site feels warm to the touch.   Have pus or a bad smell coming from your puncture site.   Have a fever.   Have chills.   Have nausea or vomiting.   Have trouble breathing.   Develop a worsening cough.  Get help right away if you:   Have extreme shortness of  breath.   Develop chest pain.   Faint or feel light-headed.  Summary   After your procedure, it is common to have some pain at the site where the needle was inserted (puncture site).   Wash your hands with soap and water before you change your bandage (dressing).   Check your puncture site every day for signs of infection.   Take over-the-counter and prescription medicines only as told by your health care provider.  This information is not intended to replace advice given to you by your health care provider. Make sure you discuss any questions you have with your health care provider.  Document Released: 07/27/2014 Document Revised: 05/31/2017 Document Reviewed: 05/31/2017  Elsevier Interactive Patient Education  2019 Elsevier Inc[employer]

## 2018-08-30 NOTE — Procedures (Signed)
PROCEDURE NOTE: THORACENTESIS    Date: 08/30/2018,   MRN# [id number] Pamela Young   Indication:Left Pleural effusion with left lung atelectasis.  A time-out was completed verifying correct patient, procedure, site, positioning, and implant(s) or special equipment if applicable.  Ultrasound guidance was used and appropriate fluid pocket was identified and marked in the left space.   Patient was positioned, prepped and draped in usual sterile fashion. 1 % Lidocaine was used to anesthetize the area.   The catheter was advanced over and introducing needle, advanced gently until yellow pleural fluid was aspirated.  The needle was then removed and the catheter was advanced, attached to the negative pressure bottle.  800 cc of fluid was drained from the left pleural space, it was serous, light yellow.  Patient had some mild cough at the end of the procedure, drainage ultimately stopped, the catheter was then removed.   Total Fluid Removed: 800 cc Color of Fluid: Yellow. Sent for: Cell Count Gram Stain Cultures LDH Glucose pH Cytology  Patient tolerated the procedure well and there were no complications.  Patient is scheduled for CT chest immediately following this procedure, therefore postop chest x-ray was now ordered.  Marda Stalker, M.D., F.C.C.P.  Board Certified in Internal Medicine, Pulmonary Medicine, [place], and Sleep Medicine.  [facility] Office Number: [phone]

## 2018-08-31 ENCOUNTER — Ambulatory Visit: Payer: PPO

## 2018-08-31 LAB — TRIGLYCERIDES, BODY FLUIDS: Triglycerides, Fluid: 22 mg/dL

## 2018-09-01 LAB — CYTOLOGY - NON PAP

## 2018-09-02 LAB — CHOLESTEROL, BODY FLUID: Cholesterol, Fluid: 80 mg/dL

## 2018-09-03 LAB — BODY FLUID CULTURE
CULTURE: NO GROWTH
SPECIAL REQUESTS: NORMAL

## 2018-09-04 ENCOUNTER — Other Ambulatory Visit: Payer: Self-pay

## 2018-09-04 ENCOUNTER — Emergency Department
Admission: EM | Admit: 2018-09-04 | Discharge: 2018-09-04 | Disposition: A | Discharge status: Home / Self Care | Payer: PPO | Attending: Emergency Medicine | Admitting: Emergency Medicine

## 2018-09-04 ENCOUNTER — Emergency Department: Payer: PPO

## 2018-09-04 DIAGNOSIS — N183 Chronic kidney disease, stage 3 (moderate): Secondary | ICD-10-CM | POA: Diagnosis not present

## 2018-09-04 DIAGNOSIS — Z7982 Long term (current) use of aspirin: Secondary | ICD-10-CM | POA: Insufficient documentation

## 2018-09-04 DIAGNOSIS — J918 Pleural effusion in other conditions classified elsewhere: Secondary | ICD-10-CM | POA: Diagnosis not present

## 2018-09-04 DIAGNOSIS — J90 Pleural effusion, not elsewhere classified: Secondary | ICD-10-CM

## 2018-09-04 DIAGNOSIS — R079 Chest pain, unspecified: Secondary | ICD-10-CM

## 2018-09-04 DIAGNOSIS — I2510 Atherosclerotic heart disease of native coronary artery without angina pectoris: Secondary | ICD-10-CM | POA: Diagnosis not present

## 2018-09-04 DIAGNOSIS — Z87891 Personal history of nicotine dependence: Secondary | ICD-10-CM | POA: Diagnosis not present

## 2018-09-04 DIAGNOSIS — Z79899 Other long term (current) drug therapy: Secondary | ICD-10-CM | POA: Insufficient documentation

## 2018-09-04 DIAGNOSIS — I129 Hypertensive chronic kidney disease with stage 1 through stage 4 chronic kidney disease, or unspecified chronic kidney disease: Secondary | ICD-10-CM | POA: Insufficient documentation

## 2018-09-04 DIAGNOSIS — R0789 Other chest pain: Secondary | ICD-10-CM | POA: Diagnosis not present

## 2018-09-04 LAB — CBC
HCT: 35 % — ABNORMAL LOW (ref 36.0–46.0)
HEMOGLOBIN: 11.1 g/dL — AB (ref 12.0–15.0)
MCH: 29.5 pg (ref 26.0–34.0)
MCHC: 31.7 g/dL (ref 30.0–36.0)
MCV: 93.1 fL (ref 80.0–100.0)
Platelets: 303 10*3/uL (ref 150–400)
RBC: 3.76 MIL/uL — ABNORMAL LOW (ref 3.87–5.11)
RDW: 14.8 % (ref 11.5–15.5)
WBC: 8.1 10*3/uL (ref 4.0–10.5)
nRBC: 0 % (ref 0.0–0.2)

## 2018-09-04 LAB — COMPREHENSIVE METABOLIC PANEL
ALK PHOS: 108 U/L (ref 38–126)
ALT: 13 U/L (ref 0–44)
AST: 23 U/L (ref 15–41)
Albumin: 3.2 g/dL — ABNORMAL LOW (ref 3.5–5.0)
Anion gap: 11 (ref 5–15)
BUN: 29 mg/dL — AB (ref 8–23)
CO2: 21 mmol/L — ABNORMAL LOW (ref 22–32)
Calcium: 9.3 mg/dL (ref 8.9–10.3)
Chloride: 104 mmol/L (ref 98–111)
Creatinine, Ser: 1.34 mg/dL — ABNORMAL HIGH (ref 0.44–1.00)
GFR calc Af Amer: 47 mL/min — ABNORMAL LOW (ref >60)
GFR calc non Af Amer: 41 mL/min — ABNORMAL LOW (ref >60)
Glucose, Bld: 137 mg/dL — ABNORMAL HIGH (ref 70–99)
Potassium: 3.9 mmol/L (ref 3.5–5.1)
Sodium: 136 mmol/L (ref 135–145)
Total Bilirubin: 0.5 mg/dL (ref 0.3–1.2)
Total Protein: 6.9 g/dL (ref 6.5–8.1)

## 2018-09-04 LAB — TROPONIN I
Troponin I: <0.03 ng/mL (ref <0.03)
Troponin I: <0.03 ng/mL (ref <0.03)

## 2018-09-04 MED ORDER — ALUM & MAG HYDROXIDE-SIMETH 200-200-20 MG/5ML PO SUSP
30.0000 mL | Freq: Once | ORAL | Status: AC
  Administered 2018-09-04: 30 mL via ORAL
  Filled 2018-09-04: qty 30

## 2018-09-04 MED ORDER — LIDOCAINE VISCOUS HCL 2 % MT SOLN
15.0000 mL | Freq: Once | OROMUCOSAL | Status: AC
  Administered 2018-09-04: 15 mL via ORAL
  Filled 2018-09-04: qty 15

## 2018-09-04 MED ORDER — FAMOTIDINE 20 MG PO TABS: 20.0000 mg | ORAL_TABLET | Freq: Every day | ORAL | 1 refills | Status: DC

## 2018-09-04 MED ORDER — ACETAMINOPHEN 500 MG PO TABS
1000.0000 mg | ORAL_TABLET | Freq: Once | ORAL | Status: AC
  Administered 2018-09-04: 1000 mg via ORAL
  Filled 2018-09-04: qty 2

## 2018-09-04 NOTE — ED Notes (Signed)
Pt assisted to bedside commode. Pt able to ambulate to toilet independently. Pt assisted back to bed. Peri-care performed independently. This RN will continue to monitor.

## 2018-09-04 NOTE — ED Notes (Signed)
Urine sent to lab 

## 2018-09-04 NOTE — Discharge Instructions (Signed)
Please seek medical attention for any high fevers, chest pain, shortness of breath, change in behavior, persistent vomiting, bloody stool or any other new or concerning symptoms.  

## 2018-09-04 NOTE — ED Notes (Signed)
Pt still c/o slight neck pain at this time after medications. MD Archie Balboa aware. This RN will continue to monitor.

## 2018-09-04 NOTE — ED Triage Notes (Addendum)
Pt c/o chest pain that radiates into back - c/o nausea and lightheadedness - x2 days Tuesday pt had "fluid pulled off lungs"

## 2018-09-04 NOTE — ED Provider Notes (Signed)
[facility] Emergency Department Provider Note  ____________________________________________   I have reviewed the triage vital signs and the nursing notes.   HISTORY  Chief Complaint Chest Pain   History limited by: Not Limited   HPI Pamela Young is a 68 y.o. female who presents to the emergency department today because of concerns for chest pain.  Patient states that the pain started yesterday.  It is located in her left chest.  It does radiate up and around her left shoulder.  Patient states the pain is been pretty constant since it started.  She did have similar pain to this once before couple of years ago.  She states at that time she did have a cardiac work-up which was negative.  She did not undergo catheterization and now feels like she needs a catheterization.  Additionally the patient is recently been seen for recurrent left-sided pleural effusions.  She has undergone thoracentesis.  She states however that she has not had this pain with previous fluid collections.  She denies any fevers.  Per medical record review patient has a history of CKD, HLD  Past Medical History:  Diagnosis Date  . Arthritis   . Borderline abnormal TFTs 02/10/2018   Overview:  Borderline TSH/FT4, that have improved, but has high TPO antibodies.  . Chronic gouty arthritis 12/22/2017  . CKD (chronic kidney disease) stage 3, GFR 30-59 ml/min (HCC) 01/27/2017   Overview:  Overview:  Being followed by Dr. Holley Raring Overview:  Being followed by Dr. Holley Raring  . Coronary artery disease involving native coronary artery of native heart 02/17/2017   Overview:  By ct scan   . Elevated erythrocyte sedimentation rate 12/10/2017  . Hyperlipemia   . Hyperlipidemia 05/31/2014  . Hypertension   . Hyperuricemia 12/10/2017  . 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 Renal disorder   . Secondary hyperparathyroidism ([place]) 07/21/2015   Overview:  Overview:   Followed by Dr. Holley Raring Overview:  Followed by Dr. Holley Raring  . Vitamin D deficiency 07/21/2015   Overview:  Overview:  Vit D 15 (checked thru nephrology, who is supplementing w/ ergocalciferol). Overview:  Vit D 15 (checked thru nephrology, who is supplementing w/ ergocalciferol).    Patient Active Problem List   Diagnosis Date Noted  . Pleural effusion 08/11/2018  . Respiratory distress 08/11/2018  . Borderline abnormal TFTs 02/10/2018  . 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 Chronic gouty arthritis 12/22/2017  . Hyperuricemia 12/10/2017  . Elevated erythrocyte sedimentation rate 12/10/2017  . Coronary artery disease involving native coronary artery of native heart 02/17/2017  . Chest pain 01/28/2017  . Obesity, Class III, BMI 40-49.9 (morbid obesity) ([place]) 01/27/2017  . CKD (chronic kidney disease) stage 3, GFR 30-59 ml/min (HCC) 01/27/2017  . Vitamin D deficiency 07/21/2015  . Secondary hyperparathyroidism ([place]) 07/21/2015  . Hypertension 05/31/2014  . Hyperlipidemia 05/31/2014    Past Surgical History:  Procedure Laterality Date  . TUBAL LIGATION      Prior to Admission medications   Medication Sig Start Date End Date Taking? Authorizing Provider  allopurinol (ZYLOPRIM) 100 MG tablet TAKE 2 TABLETS BY MOUTH ONCE DAILY FOR 30 DAYS 01/18/18   [provider]  amLODipine (NORVASC) 10 MG tablet Take 10 mg by mouth daily.    [provider]  aspirin 81 MG chewable tablet Chew 81 mg by mouth daily.    [provider]  atenolol (TENORMIN) 25 MG tablet Take 25 mg  by mouth daily.    [provider]  atorvastatin (LIPITOR) 20 MG tablet Take 1 tablet by mouth daily. 11/17/17   [provider]  hydrALAZINE (APRESOLINE) 100 MG tablet Take 1 tablet by mouth 3 (three) times daily. 11/17/17   [provider]  hydroxychloroquine (PLAQUENIL) 200 MG tablet TAKE 1 TABLET BY MOUTH TWICE DAILY FOR 30 DAYS 02/04/18   [provider]  losartan  (COZAAR) 100 MG tablet Take 100 mg by mouth daily.    [provider]  triamterene-hydrochlorothiazide (MAXZIDE) 75-50 MG tablet Take 1 tablet by mouth daily.    [provider]    Allergies Codeine  Family History  Problem Relation Age of Onset  . Heart attack Mother   . Heart attack Father   . Hypertension Sister   . Hypertension Brother     Social History Social History   Tobacco Use  . Smoking status: Former Research scientist (life sciences)  . Smokeless tobacco: Never Used  Substance Use Topics  . Alcohol use: No    Frequency: Never  . Drug use: No    Review of Systems Constitutional: No fever/chills Eyes: No visual changes. ENT: No sore throat. Cardiovascular: Positive for chest pain. Respiratory: Denies shortness of breath. Gastrointestinal: No abdominal pain.  Positive for nausea.  Genitourinary: Negative for dysuria. Musculoskeletal: Negative for back pain. Skin: Negative for rash. Neurological: Negative for headaches, focal weakness or numbness.  ____________________________________________   PHYSICAL EXAM:  VITAL SIGNS: ED Triage Vitals  Enc Vitals Group     BP 09/04/18 1754 (!) 139/57     Pulse Rate 09/04/18 1754 82     Resp 09/04/18 1754 20     Temp 09/04/18 1754 98.4 F (36.9 C)     Temp Source 09/04/18 1754 Oral     SpO2 09/04/18 1754 96 %     Weight 09/04/18 1751 220 lb (99.8 kg)     Height 09/04/18 1751 5\' 6"  (1.676 m)     Head Circumference --      Peak Flow --      Pain Score 09/04/18 1751 8     Pain Loc --    Constitutional: Alert and oriented.  Eyes: Conjunctivae are normal.  ENT      Head: Normocephalic and atraumatic.      Nose: No congestion/rhinnorhea.      Mouth/Throat: Mucous membranes are moist.      Neck: No stridor. Hematological/Lymphatic/Immunilogical: No cervical lymphadenopathy. Cardiovascular: Normal rate, regular rhythm.  No murmurs, rubs, or gallops. Respiratory: Normal respiratory effort without tachypnea nor  retractions. Diminished breath sounds in left lower lung.  Gastrointestinal: Soft and non tender. No rebound. No guarding.  Genitourinary: Deferred Musculoskeletal: Normal range of motion in all extremities. No lower extremity edema. Neurologic:  Normal speech and language. No gross focal neurologic deficits are appreciated.  Skin:  Skin is warm, dry and intact. No rash noted. Psychiatric: Mood and affect are normal. Speech and behavior are normal. Patient exhibits appropriate insight and judgment.  ____________________________________________    LABS (pertinent positives/negatives)  Trop <0.03 CBC wbc 8.1, hgb 11.1, plt 303 CMP na 136, k 3.9, glu 137, cr 1.34 ____________________________________________   EKG  I, Nance Pear, attending physician, personally viewed and interpreted this EKG  EKG Time: 1754 Rate: 81 Rhythm: normal sinus rhythm Axis: normal Intervals: qtc 439 QRS: low voltage qrs ST changes: no st elevation Impression: abnormal ekg   ____________________________________________    RADIOLOGY  CXR Re-accumulation of left pleural effusion ____________________________________________  PROCEDURES  Procedures  ____________________________________________   INITIAL IMPRESSION / ASSESSMENT AND PLAN / ED COURSE  Pertinent labs & imaging results that were available during my care of the patient were reviewed by me and considered in my medical decision making (see chart for details).   Patient presented to the emergency department today because of concerns for left-sided chest pain that does radiate slightly to her back.  Differential would be broad including ACS, esophagitis, pneumothorax, PE, pneumonia amongst other etiologies.  CXR does show reaccumulation of her pleural effusion.  I do think this could be explaining some the patient's discomfort.  Troponin negative x2.  Patient did feel some improvement after GI cocktail also raising consideration for  esophagitis.  Discussed with patient importance of following up with cardiology and pulmonology.  Discussed that she might benefit from repeat thoracentesis.   ____________________________________________   FINAL CLINICAL IMPRESSION(S) / ED DIAGNOSES  Final diagnoses:  Nonspecific chest pain  Pleural effusion     Note: This dictation was prepared with Dragon dictation. Any transcriptional errors that result from this process are unintentional     Nance Pear, MD 09/05/18 0010

## 2018-09-05 ENCOUNTER — Telehealth: Payer: Self-pay | Admitting: Internal Medicine

## 2018-09-05 NOTE — Telephone Encounter (Signed)
The fluid is coming back but not as much as before. Lets see if we can get her in on Wednesday.

## 2018-09-05 NOTE — Telephone Encounter (Signed)
Will close encounter, as nothing further is needed.  

## 2018-09-05 NOTE — Progress Notes (Signed)
Pamela Young     Assessment and Plan:  Recurrent left pleural effusion. - Patient has an undiagnosed atypical left pleural effusion.  This appears to persist/reaccumulating after thoracentesis x 2.  - We will repeat thoracentesis for third time, repeat fluid studies.  --Discussed with patient and daughter-in-law that if this recurs will refer her to thoracic surgery to discuss possible options including PleurX, Talc Pleurodesis, possible pleural biopsy.      Date: 09/05/2018  MRN# [id number] Pamela Young [birth year]  Referring Physician: Dr. Estanislado Pandy for pleural effusion.   Pamela Young is a 68 y.o. old female seen in consultation for chief complaint of:    No chief complaint on file.   HPI:   The patient is a 68 year old female with a recent admission to the hospital with dyspnea.  She was found to have a large left pleural effusion, with near complete left lung atelectasis.  She was admitted to the hospital on 08/10/2018, underwent thoracentesis and was discharged on 1/24.  She was seen outpatient and underwent an additional left-sided thoracentesis due to reaccumulation of the pleural fluid. Review of thoracentesis cytology results shows no malignancy.  The fluid remains exudative, though inflammatory markers including protein, LDH, lymphocyte count have all reduced from the previous thoracentesis.  Repeat chest x-ray on 09/04/2018 shows the left lung has halfway filled up with pleural fluid.  Left-sided thoracentesis on 08/30/2018, removal of 800 cc. left-sided thoracentesis on 08/11/2018, with removal of 1600 cc   She has no inside pets, denies reflux, occasional sinus drainage.  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  She has been trying to lose weight and has lost >60 pounds.  She denies cough, no hemoptysis.  No previous diagnosis of sleep apnea. She is sleepy during the day, she snores at night.   **Thoracentesis  08/11/2018>> fluid appears exudative.  Predominantly lymphocytic, cytology negative, culture negative. **Chest x-ray 1/22, CT chest 08/11/2018>> there is a large left-sided pleural effusion, there is a possible mass within atelectatic lung, however it is obscured. **Echocardiogram 01/28/2017>> Ultrasound enhancing agent utilized to improve endocardial border  definition  Left ventricular hypertrophy - mild  Normal left ventricular systolic function, ejection fraction 60 to 99%  Diastolic dysfunction - grade I (normal filling pressures)  Aortic sclerosis  Normal right ventricular systolic function  Mildly elevated right atrial pressure  Medication:    Current Outpatient Medications:  .  allopurinol (ZYLOPRIM) 100 MG tablet, TAKE 2 TABLETS BY MOUTH ONCE DAILY FOR 30 DAYS, Disp: , Rfl: 2 .  amLODipine (NORVASC) 10 MG tablet, Take 10 mg by mouth daily., Disp: , Rfl:  .  aspirin 81 MG chewable tablet, Chew 81 mg by mouth daily., Disp: , Rfl:  .  atenolol (TENORMIN) 25 MG tablet, Take 25 mg by mouth daily., Disp: , Rfl:  .  atorvastatin (LIPITOR) 20 MG tablet, Take 1 tablet by mouth daily., Disp: , Rfl: 4 .  cefdinir (OMNICEF) 300 MG capsule, TAKE 1 CAPSULE BY MOUTH TWICE DAILY FOR 10 DAYS, Disp: , Rfl:  .  famotidine (PEPCID) 20 MG tablet, Take 1 tablet (20 mg total) by mouth daily., Disp: 30 tablet, Rfl: 1 .  fluticasone (FLONASE) 50 MCG/ACT nasal spray, USE 2 SPRAY(S) IN EACH NOSTRIL ONCE DAILY FOR 90 DAYS, Disp: , Rfl:  .  hydrALAZINE (APRESOLINE) 100 MG tablet, Take 1 tablet by mouth 3 (three) times daily., Disp: , Rfl: 12 .  hydroxychloroquine (PLAQUENIL) 200  MG tablet, TAKE 1 TABLET BY MOUTH TWICE DAILY FOR 30 DAYS, Disp: , Rfl: 1 .  hydrOXYzine (ATARAX/VISTARIL) 25 MG tablet, TAKE 1 TABLET BY MOUTH THREE TIMES DAILY AS NEEDED FOR ITCHING FOR UP TO 10 DAYS, Disp: , Rfl:  .  losartan (COZAAR) 100 MG tablet, Take 100 mg by mouth daily., Disp: , Rfl:  .  triamterene-hydrochlorothiazide  (MAXZIDE) 75-50 MG tablet, Take 1 tablet by mouth daily., Disp: , Rfl:    Allergies:  Codeine  Past Medical History:  Diagnosis Date  . Arthritis   . Borderline abnormal TFTs 02/10/2018   Overview:  Borderline TSH/FT4, that have improved, but has high TPO antibodies.  . Chronic gouty arthritis 12/22/2017  . CKD (chronic kidney disease) stage 3, GFR 30-59 ml/min (HCC) 01/27/2017   Overview:  Overview:  Being followed by Dr. Holley Raring Overview:  Being followed by Dr. Holley Raring  . Coronary artery disease involving native coronary artery of native heart 02/17/2017   Overview:  By ct scan   . Elevated erythrocyte sedimentation rate 12/10/2017  . Hyperlipemia   . Hyperlipidemia 05/31/2014  . Hypertension   . Hyperuricemia 12/10/2017  . 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 Renal disorder   . Secondary hyperparathyroidism ([place]) 07/21/2015   Overview:  Overview:  Followed by Dr. Holley Raring Overview:  Followed by Dr. Holley Raring  . Vitamin D deficiency 07/21/2015   Overview:  Overview:  Vit D 15 (checked thru nephrology, who is supplementing w/ ergocalciferol). Overview:  Vit D 15 (checked thru nephrology, who is supplementing w/ ergocalciferol).   Review of Systems:  Constitutional: Feels well. Cardiovascular: Denies chest pain, exertional chest pain.  Pulmonary: Denies hemoptysis, pleuritic chest pain.   The remainder of systems were reviewed and were found to be negative other than what is documented in the HPI.    Physical Examination:   VS: BP (!) 142/60 (BP Location: Right Arm, Cuff Size: Large)   Pulse 91   Resp 16   Ht 5\' 6"  (1.676 m)   Wt 221 lb (100.2 kg)   SpO2 95%   BMI 35.67 kg/m   General Appearance: No distress  Neuro:without focal findings, mental status, speech normal, alert and oriented HEENT: PERRLA, EOM intact Pulmonary: No wheezing, No rales  CardiovascularNormal S1,S2.  No m/r/g.  Abdomen: Benign, Soft, non-tender, No masses Renal:   No costovertebral tenderness  GU:  No performed at this time. Endoc: No evident thyromegaly, no signs of acromegaly or Cushing features Skin:   warm, no rashes, no ecchymosis  Extremities: normal, no cyanosis, clubbing.      LABORATORY PANEL:   CBC Recent Labs  Lab 09/04/18 1759  WBC 8.1  HGB 11.1*  HCT 35.0*  PLT 303   ------------------------------------------------------------------------------------------------------------------  Chemistries  Recent Labs  Lab 09/04/18 1759  NA 136  K 3.9  CL 104  CO2 21*  GLUCOSE 137*  BUN 29*  CREATININE 1.34*  CALCIUM 9.3  AST 23  ALT 13  ALKPHOS 108  BILITOT 0.5   ------------------------------------------------------------------------------------------------------------------  Cardiac Enzymes Recent Labs  Lab 09/04/18 2123  TROPONINI <0.03   ------------------------------------------------------------  RADIOLOGY:  Dg Chest 2 View  Result Date: 09/04/2018 CLINICAL DATA:  Pain the radiates to back EXAM: CHEST - 2 VIEW COMPARISON:  CT 08/30/2018, radiograph 08/11/2018, CT 08/11/2018 FINDINGS: Normal cardiac silhouette. There is reaccumulation of the LEFT unilocular pleural effusion which occupies approximately a third of the LEFT hemithorax volume. RIGHT lung  clear. IMPRESSION: Re-accumulation of large LEFT effusion. Electronically Signed   By: Suzy Bouchard M.D.   On: 09/04/2018 18:41       Thank  you for the consultation and for allowing [facility] to assist in the care of your patient. Our recommendations are noted above.  Please contact us if we can be of further service.   Marda Stalker, M.D., F.C.C.P.  Board Certified in Internal [place], Pulmonary [place], [place], and Sleep [place].  [facility] Office Number: [phone]   09/05/2018

## 2018-09-05 NOTE — Telephone Encounter (Signed)
Patient returned call - I have her scheduled with Dr. Ashby Dawes on 09/07/2018 - pt needed nothing further -pr

## 2018-09-05 NOTE — Telephone Encounter (Signed)
lmtcb x1 for pt. 

## 2018-09-05 NOTE — Telephone Encounter (Signed)
Called and spoke to pt. Pt states she was seen at ED yesterday for chest discomfort.  Pt had CXR and was informed that she has plural effusion. Pt had Thoracentesis on 08/30/18. Pt is requesting that Dr. Ashby Dawes review CXR and let her know if she should f/u sooner then 09/09/18. Pt denied increased sob, fever, chills or sweats. Denied additional symptoms.  Dr. Ashby Dawes please advise. Thanks.

## 2018-09-07 ENCOUNTER — Encounter: Payer: Self-pay | Admitting: Internal Medicine

## 2018-09-07 ENCOUNTER — Ambulatory Visit (INDEPENDENT_AMBULATORY_CARE_PROVIDER_SITE_OTHER): Payer: PPO | Admitting: Internal Medicine

## 2018-09-07 ENCOUNTER — Other Ambulatory Visit: Payer: Self-pay | Admitting: Internal Medicine

## 2018-09-07 VITALS — BP 142/60 | HR 91 | Resp 16 | Ht 66.0 in | Wt 221.0 lb

## 2018-09-07 DIAGNOSIS — J90 Pleural effusion, not elsewhere classified: Secondary | ICD-10-CM

## 2018-09-07 NOTE — Addendum Note (Signed)
Addended by: Stephanie Coup on: 09/07/2018 11:26 AM   Modules accepted: Orders

## 2018-09-07 NOTE — Patient Instructions (Signed)
Will plan for repeat thoracentesis next week.  Will refer to thoracic surgery for appointment after drainage procedure.

## 2018-09-07 NOTE — Progress Notes (Deleted)
[facility] Consultation      Assessment and Plan:  Recurrent left pleural effusion. Pneumonia. - Patient has an undiagnosed atypical left pleural effusion, now status post thoracentesis x2. - We will repeat thoracentesis followed immediately by CT chest to look for evidence of lung mass.  Based on these results we will decide on further work-up.   Date: 09/07/2018  MRN# [id number] Pamela Young Aug 27, 1950  Referring Physician: Dr. Estanislado Pandy for pleural effusion.   Pamela Young is a 68 y.o. old female seen in consultation for chief complaint of:    No chief complaint on file.   HPI:   The patient is a 68 year old female with a recent admission to the hospital with dyspnea.  She was found to have a large left pleural effusion, with near complete left lung atelectasis.  She underwent a second thoracentesis, followed immediately by CT chest which showed no evidence of significant LN normalities in the lung.  Review of thoracentesis cytology results shows no malignancy.  The fluid remains exudative, though inflammatory markers including protein, LDH, lymphocyte count have all reduced from the previous thoracentesis.  Left-sided thoracentesis on 08/30/2018, removal of 800 cc. left-sided thoracentesis on 08/11/2018, with removal of 1600 cc  Around that time she had been having bronchitis and cough. She had never had this problem in the past. She normally feels that her breathing is "fine". She is not very active, she lives with her husband at home she lives independently, she drives a car. She can walk a supermaket and can carry the groceries in the house. She does not have dyspnea with usual daily activity.  She did not have issues with cough in the past. No previous respiratory diagnosis.  She last smoked about 11 years ago, she smoked about a ppd, she smoked for about 15-18 years.  She has no inside pets, denies reflux, occasional sinus drainage.  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  She has been trying to lose weight and has lost >60 pounds.  She denies cough, no hemoptysis.  No previous diagnosis of sleep apnea. She is sleepy during the day, she snores at night.   **Thoracentesis 08/11/2018>> fluid appears exudative.  Predominantly lymphocytic, cytology negative, culture negative. **Chest x-ray 1/22, CT chest 08/11/2018>> there is a large left-sided pleural effusion, there is a possible mass within atelectatic lung, however it is obscured. **Echocardiogram 01/28/2017>> Ultrasound enhancing agent utilized to improve endocardial border  definition  Left ventricular hypertrophy - mild  Normal left ventricular systolic function, ejection fraction 60 to 44%  Diastolic dysfunction - grade I (normal filling pressures)  Aortic sclerosis  Normal right ventricular systolic function  Mildly elevated right atrial pressure  Medication:    Current Outpatient Medications:  .  allopurinol (ZYLOPRIM) 100 MG tablet, TAKE 2 TABLETS BY MOUTH ONCE DAILY FOR 30 DAYS, Disp: , Rfl: 2 .  amLODipine (NORVASC) 10 MG tablet, Take 10 mg by mouth daily., Disp: , Rfl:  .  aspirin 81 MG chewable tablet, Chew 81 mg by mouth daily., Disp: , Rfl:  .  atenolol (TENORMIN) 25 MG tablet, Take 25 mg by mouth daily., Disp: , Rfl:  .  atorvastatin (LIPITOR) 20 MG tablet, Take 1 tablet by mouth daily., Disp: , Rfl: 4 .  cefdinir (OMNICEF) 300 MG capsule, TAKE 1 CAPSULE BY MOUTH TWICE DAILY FOR 10 DAYS, Disp: , Rfl:  .  famotidine (PEPCID) 20 MG tablet, Take 1 tablet (  20 mg total) by mouth daily., Disp: 30 tablet, Rfl: 1 .  fluticasone (FLONASE) 50 MCG/ACT nasal spray, USE 2 SPRAY(S) IN EACH NOSTRIL ONCE DAILY FOR 90 DAYS, Disp: , Rfl:  .  hydrALAZINE (APRESOLINE) 100 MG tablet, Take 1 tablet by mouth 3 (three) times daily., Disp: , Rfl: 12 .  hydroxychloroquine (PLAQUENIL) 200 MG tablet, TAKE 1 TABLET BY MOUTH TWICE DAILY FOR 30 DAYS, Disp:  , Rfl: 1 .  hydrOXYzine (ATARAX/VISTARIL) 25 MG tablet, TAKE 1 TABLET BY MOUTH THREE TIMES DAILY AS NEEDED FOR ITCHING FOR UP TO 10 DAYS, Disp: , Rfl:  .  losartan (COZAAR) 100 MG tablet, Take 100 mg by mouth daily., Disp: , Rfl:  .  triamterene-hydrochlorothiazide (MAXZIDE) 75-50 MG tablet, Take 1 tablet by mouth daily., Disp: , Rfl:    Allergies:  Codeine      LABORATORY PANEL:   CBC Recent Labs  Lab 09/04/18 1759  WBC 8.1  HGB 11.1*  HCT 35.0*  PLT 303   ------------------------------------------------------------------------------------------------------------------  Chemistries  Recent Labs  Lab 09/04/18 1759  NA 136  K 3.9  CL 104  CO2 21*  GLUCOSE 137*  BUN 29*  CREATININE 1.34*  CALCIUM 9.3  AST 23  ALT 13  ALKPHOS 108  BILITOT 0.5   ------------------------------------------------------------------------------------------------------------------  Cardiac Enzymes Recent Labs  Lab 09/04/18 2123  TROPONINI <0.03   ------------------------------------------------------------  RADIOLOGY:  No results found.     Thank  you for the consultation and for allowing [facility] to assist in the care of your patient. Our recommendations are noted above.  Please contact us if we can be of further service.   Marda Stalker, M.D., F.C.C.P.  Board Certified in Internal Medicine, Pulmonary Medicine, [place], and Sleep Medicine.  [facility] Office Number: [phone]   09/07/2018

## 2018-09-09 ENCOUNTER — Ambulatory Visit: Payer: PPO | Admitting: Internal Medicine

## 2018-09-12 ENCOUNTER — Ambulatory Visit
Admission: RE | Admit: 2018-09-12 | Discharge: 2018-09-12 | Disposition: A | Discharge status: Home / Self Care | Payer: PPO | Source: Ambulatory Visit | Attending: Internal Medicine | Admitting: Internal Medicine

## 2018-09-12 DIAGNOSIS — Z9889 Other specified postprocedural states: Secondary | ICD-10-CM | POA: Diagnosis not present

## 2018-09-12 DIAGNOSIS — J9811 Atelectasis: Secondary | ICD-10-CM | POA: Diagnosis not present

## 2018-09-12 DIAGNOSIS — R05 Cough: Secondary | ICD-10-CM | POA: Insufficient documentation

## 2018-09-12 DIAGNOSIS — J90 Pleural effusion, not elsewhere classified: Secondary | ICD-10-CM | POA: Diagnosis not present

## 2018-09-12 LAB — COMPREHENSIVE METABOLIC PANEL
ALT: 14 U/L (ref 0–44)
AST: 19 U/L (ref 15–41)
Albumin: 3.3 g/dL — ABNORMAL LOW (ref 3.5–5.0)
Alkaline Phosphatase: 98 U/L (ref 38–126)
Anion gap: 7 (ref 5–15)
BUN: 27 mg/dL — ABNORMAL HIGH (ref 8–23)
CO2: 20 mmol/L — ABNORMAL LOW (ref 22–32)
Calcium: 9.1 mg/dL (ref 8.9–10.3)
Chloride: 106 mmol/L (ref 98–111)
Creatinine, Ser: 1.32 mg/dL — ABNORMAL HIGH (ref 0.44–1.00)
GFR calc Af Amer: 48 mL/min — ABNORMAL LOW (ref >60)
GFR calc non Af Amer: 42 mL/min — ABNORMAL LOW (ref >60)
Glucose, Bld: 118 mg/dL — ABNORMAL HIGH (ref 70–99)
Potassium: 3.9 mmol/L (ref 3.5–5.1)
SODIUM: 133 mmol/L — AB (ref 135–145)
Total Bilirubin: 0.5 mg/dL (ref 0.3–1.2)
Total Protein: 6.9 g/dL (ref 6.5–8.1)

## 2018-09-12 LAB — BODY FLUID CELL COUNT WITH DIFFERENTIAL
Eos, Fluid: 1 %
Lymphs, Fluid: 82 %
Monocyte-Macrophage-Serous Fluid: 8 %
Neutrophil Count, Fluid: 9 %
Total Nucleated Cell Count, Fluid: 782 cu mm

## 2018-09-12 LAB — PROTEIN, PLEURAL OR PERITONEAL FLUID: Total protein, fluid: 3.4 g/dL

## 2018-09-12 LAB — ALBUMIN, PLEURAL OR PERITONEAL FLUID: Albumin, Fluid: 2.1 g/dL

## 2018-09-12 LAB — LACTATE DEHYDROGENASE, PLEURAL OR PERITONEAL FLUID: LD, Fluid: 116 U/L — ABNORMAL HIGH (ref 3–23)

## 2018-09-12 LAB — AMYLASE, PLEURAL OR PERITONEAL FLUID: Amylase, Fluid: 48 U/L

## 2018-09-12 MED ORDER — SODIUM CHLORIDE FLUSH 0.9 % IV SOLN
INTRAVENOUS | Status: AC
  Filled 2018-09-12: qty 10

## 2018-09-12 NOTE — Progress Notes (Signed)
Labs drawn. Chest xray shows no pneumothorax. IV dcd. Pt discharged ambulatory in stable condition accompanied by her husband.

## 2018-09-12 NOTE — Procedures (Signed)
PROCEDURE NOTE: LEFT THORACENTESIS    Date: 09/12/2018,  Loc:No information available for this encounter. MRN# [id number] Jazsmine Macari   Indication: Recurrent left pleural effusion.  A time-out was completed verifying correct patient, procedure, site, positioning, and implant(s) or special equipment if applicable.  Ultrasound guidance was used and appropriate fluid pocket was identified and marked.  Ultrasound was used to identify the left pleural space, there was a moderate minimally loculated left pleural effusion, atelectatic lung was seen.  The left lung was scanned from the base to the apex and the mid scapular line.  An appropriate area was identified and marked at approximately the ninth interspace in the mid scapular line.  The site was prepped with sterile technique.  1% lidocaine was infiltrated into the skin and soft tissues.  The thoracentesis catheter was passed over the introducer needle, and into the pleural space, light straw-colored fluid was aspirated.  The needle was then removed, the catheter was advanced and kept in place.  The cath was hooked up to the negative pressure bottle, approximately 600 cc of clear yellow serous straw-colored fluid was removed from the left pleural space until drainage stopped spontaneously.  Patient had a mild cough at the end of the procedure.  Catheter was removed.   A chest xray was ordered to evaluate for pneumothorax.  Total Fluid Removed: 600 cc Color of Fluid: Yellow, appeared normal. Sent for: Cell Count Gram Stain Cultures LDH Cytology  Patient tolerated the procedure well and there were no complications.  Marda Stalker, M.D., F.C.C.P.  Board Certified in Internal Medicine, Pulmonary Medicine, [place], and Sleep Medicine.  [facility] Office Number: [phone]

## 2018-09-13 LAB — TRIGLYCERIDES, BODY FLUIDS: Triglycerides, Fluid: 18 mg/dL

## 2018-09-14 ENCOUNTER — Other Ambulatory Visit: Payer: Self-pay | Admitting: *Deleted

## 2018-09-14 DIAGNOSIS — J90 Pleural effusion, not elsewhere classified: Secondary | ICD-10-CM

## 2018-09-14 LAB — ACID FAST SMEAR (AFB, MYCOBACTERIA): Acid Fast Smear: NEGATIVE

## 2018-09-14 LAB — COMP PANEL: LEUKEMIA/LYMPHOMA

## 2018-09-14 LAB — CHOLESTEROL, BODY FLUID: Cholesterol, Fluid: 76 mg/dL

## 2018-09-14 LAB — CYTOLOGY - NON PAP

## 2018-09-14 NOTE — Progress Notes (Signed)
Pt aware to have cxr before appt with Dr. Genevive Bi.

## 2018-09-16 LAB — BODY FLUID CULTURE
Culture: NO GROWTH
Special Requests: NORMAL

## 2018-09-19 ENCOUNTER — Encounter: Payer: Self-pay | Admitting: Cardiothoracic Surgery

## 2018-09-19 ENCOUNTER — Ambulatory Visit
Admission: RE | Admit: 2018-09-19 | Discharge: 2018-09-19 | Disposition: A | Discharge status: Home / Self Care | Payer: PPO | Source: Ambulatory Visit | Attending: Internal Medicine | Admitting: Internal Medicine

## 2018-09-19 ENCOUNTER — Other Ambulatory Visit: Payer: Self-pay | Admitting: Cardiothoracic Surgery

## 2018-09-19 ENCOUNTER — Other Ambulatory Visit: Payer: Self-pay

## 2018-09-19 ENCOUNTER — Ambulatory Visit
Admission: RE | Admit: 2018-09-19 | Discharge: 2018-09-19 | Disposition: A | Discharge status: Home / Self Care | Payer: PPO | Attending: Internal Medicine | Admitting: Internal Medicine

## 2018-09-19 ENCOUNTER — Ambulatory Visit (INDEPENDENT_AMBULATORY_CARE_PROVIDER_SITE_OTHER): Payer: PPO | Admitting: Cardiothoracic Surgery

## 2018-09-19 VITALS — BP 145/70 | HR 74 | Temp 97.5°F | Resp 18 | Ht 66.0 in | Wt 222.0 lb

## 2018-09-19 DIAGNOSIS — J90 Pleural effusion, not elsewhere classified: Secondary | ICD-10-CM

## 2018-09-19 NOTE — H&P (View-Only) (Signed)
Patient ID: Pamela Young, female   DOB: 25-Jan-1951, 68 y.o.   MRN: [id number]  Chief Complaint  Patient presents with  . Other    Referred By Dr. Ashby Dawes Reason for Referral recurrent left pleural effusion  HPI Location, Quality, Duration, Severity, Timing, Context, Modifying Factors, Associated Signs and Symptoms.  Pamela Young is a 68 y.o. female.  She states that her problems began back in November and December when she developed an upper respiratory tract infection which she describes as some nasal drainage cough some shortness of breath and presented to her primary care physician.  She was symptomatically treated and then in January she was admitted through the hospital with a large left-sided pleural effusion.  She underwent a thoracentesis and had a work-up performed which did not reveal any specific etiology for her left-sided pleural effusion.  She was subsequently followed as an outpatient and underwent 2 additional ultrasound-guided thoracentesis.  The work-up with these pleural effusions has been nondiagnostic.  Specifically the cytology is all been negative.  She is has a 10-year smoking history but quit about 15 years ago.  There is no specific family history of lung disease.  She worked in the [employer] but has no known asbestos exposure.  She states that the first time she did have significant relief of her symptoms with a thoracentesis but the last 2 times her symptomatic relief was less.  She presents now for consideration of thoracoscopy with pleural biopsy and talc pleurodesis.   Past Medical History:  Diagnosis Date  . Arthritis   . Borderline abnormal TFTs 02/10/2018   Overview:  Borderline TSH/FT4, that have improved, but has high TPO antibodies.  . Chronic gouty arthritis 12/22/2017  . CKD (chronic kidney disease) stage 3, GFR 30-59 ml/min (HCC) 01/27/2017   Overview:  Overview:  Being followed by Dr. Holley Raring Overview:  Being followed by Dr. Holley Raring  . Coronary artery  disease involving native coronary artery of native heart 02/17/2017   Overview:  By ct scan   . Elevated erythrocyte sedimentation rate 12/10/2017  . Hyperlipemia   . Hyperlipidemia 05/31/2014  . Hypertension   . Hyperuricemia 12/10/2017  . 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 Renal disorder   . Secondary hyperparathyroidism ([place]) 07/21/2015   Overview:  Overview:  Followed by Dr. Holley Raring Overview:  Followed by Dr. Holley Raring  . Vitamin D deficiency 07/21/2015   Overview:  Overview:  Vit D 15 (checked thru nephrology, who is supplementing w/ ergocalciferol). Overview:  Vit D 15 (checked thru nephrology, who is supplementing w/ ergocalciferol).    Past Surgical History:  Procedure Laterality Date  . TUBAL LIGATION      Family History  Problem Relation Age of Onset  . Heart attack Mother   . Heart attack Father   . Hypertension Sister   . Hypertension Brother     Social History Social History   Tobacco Use  . Smoking status: Former Research scientist (life sciences)  . Smokeless tobacco: Never Used  Substance Use Topics  . Alcohol use: No    Frequency: Never  . Drug use: No    Allergies  Allergen Reactions  . Codeine     'makes me feel funny'    Current Outpatient Medications  Medication Sig Dispense Refill  . allopurinol (ZYLOPRIM) 100 MG tablet TAKE 2 TABLETS BY MOUTH ONCE DAILY FOR 30 DAYS  2  . amLODipine (NORVASC) 10 MG tablet Take 10 mg by mouth  daily.    . atenolol (TENORMIN) 25 MG tablet Take 25 mg by mouth daily.    Marland Kitchen atorvastatin (LIPITOR) 20 MG tablet Take 1 tablet by mouth daily.  4  . famotidine (PEPCID) 20 MG tablet Take 1 tablet (20 mg total) by mouth daily. 30 tablet 1  . hydrALAZINE (APRESOLINE) 100 MG tablet Take 1 tablet by mouth 3 (three) times daily.  12  . losartan (COZAAR) 100 MG tablet Take 100 mg by mouth daily.    Marland Kitchen triamterene-hydrochlorothiazide (MAXZIDE) 75-50 MG tablet Take 1 tablet by mouth daily.     No current  facility-administered medications for this visit.       Review of Systems A complete review of systems was asked and was negative except for the following positive findings easy bruising and history of chronic kidney disease  Blood pressure (!) 145/70, pulse 74, temperature (!) 97.5 F (36.4 C), temperature source Skin, resp. rate 18, height 5\' 6"  (1.676 m), weight 222 lb (100.7 kg), SpO2 95 %.  Physical Exam CONSTITUTIONAL:  Pleasant, well-developed, well-nourished, and in no acute distress. EYES: Pupils equal and reactive to light, Sclera non-icteric EARS, NOSE, MOUTH AND THROAT:  The oropharynx was clear.  Dentition is absent.  Oral mucosa pink and moist. LYMPH NODES:  Lymph nodes in the neck and axillae were normal RESPIRATORY:  Lungs were clear and slightly diminished at the left base.  Normal respiratory effort without pathologic use of accessory muscles of respiration CARDIOVASCULAR: Heart was regular without murmurs.  There were no carotid bruits. GI: The abdomen was soft, nontender, and nondistended. There were no palpable masses. There was no hepatosplenomegaly. There were normal bowel sounds in all quadrants. GU:  Rectal deferred.   MUSCULOSKELETAL:  Normal muscle strength and tone.  No clubbing or cyanosis.   SKIN:  There were no pathologic skin lesions.  There were no nodules on palpation. NEUROLOGIC:  Sensation is normal.  Cranial nerves are grossly intact. PSYCH:  Oriented to person, place and time.  Mood and affect are normal.  Data Reviewed X-rays and CT scans  I have personally reviewed the patient's imaging, laboratory findings and medical records.    Assessment    Recurrent left-sided pleural effusion.  Diagnosis unclear.    Plan    I reviewed with her the indications and risks of left-sided thoracoscopy with pleural biopsy and talc pleurodesis.  We also discussed the potential role placing a Pleurx catheter at the same time.  I reviewed with her the risks of  bleeding, infection and recurrence.  She understands and would like [place] to proceed.  We will go ahead and set her up for a thoracoscopy possible thoracotomy with pleural biopsy and talc pleurodesis.       Nestor Lewandowsky, MD 09/19/2018, 10:29 AM

## 2018-09-19 NOTE — Patient Instructions (Addendum)
We will schedule your surgery. We will arrange home health care for the Pleurx Catheter drainage and dressing changes.

## 2018-09-19 NOTE — Progress Notes (Signed)
Patient ID: Pamela Young, female   DOB: 1951-02-07, 68 y.o.   MRN: [id number]  Chief Complaint  Patient presents with  . Other    Referred By Dr. Ashby Dawes Reason for Referral recurrent left pleural effusion  HPI Location, Quality, Duration, Severity, Timing, Context, Modifying Factors, Associated Signs and Symptoms.  Pamela Young is a 68 y.o. female.  She states that her problems began back in November and December when she developed an upper respiratory tract infection which she describes as some nasal drainage cough some shortness of breath and presented to her primary care physician.  She was symptomatically treated and then in January she was admitted through the hospital with a large left-sided pleural effusion.  She underwent a thoracentesis and had a work-up performed which did not reveal any specific etiology for her left-sided pleural effusion.  She was subsequently followed as an outpatient and underwent 2 additional ultrasound-guided thoracentesis.  The work-up with these pleural effusions has been nondiagnostic.  Specifically the cytology is all been negative.  She is has a 10-year smoking history but quit about 15 years ago.  There is no specific family history of lung disease.  She worked in the [employer] but has no known asbestos exposure.  She states that the first time she did have significant relief of her symptoms with a thoracentesis but the last 2 times her symptomatic relief was less.  She presents now for consideration of thoracoscopy with pleural biopsy and talc pleurodesis.   Past Medical History:  Diagnosis Date  . Arthritis   . Borderline abnormal TFTs 02/10/2018   Overview:  Borderline TSH/FT4, that have improved, but has high TPO antibodies.  . Chronic gouty arthritis 12/22/2017  . CKD (chronic kidney disease) stage 3, GFR 30-59 ml/min (HCC) 01/27/2017   Overview:  Overview:  Being followed by Dr. Holley Raring Overview:  Being followed by Dr. Holley Raring  . Coronary artery  disease involving native coronary artery of native heart 02/17/2017   Overview:  By ct scan   . Elevated erythrocyte sedimentation rate 12/10/2017  . Hyperlipemia   . Hyperlipidemia 05/31/2014  . Hypertension   . Hyperuricemia 12/10/2017  . 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 Renal disorder   . Secondary hyperparathyroidism ([place]) 07/21/2015   Overview:  Overview:  Followed by Dr. Holley Raring Overview:  Followed by Dr. Holley Raring  . Vitamin D deficiency 07/21/2015   Overview:  Overview:  Vit D 15 (checked thru nephrology, who is supplementing w/ ergocalciferol). Overview:  Vit D 15 (checked thru nephrology, who is supplementing w/ ergocalciferol).    Past Surgical History:  Procedure Laterality Date  . TUBAL LIGATION      Family History  Problem Relation Age of Onset  . Heart attack Mother   . Heart attack Father   . Hypertension Sister   . Hypertension Brother     Social History Social History   Tobacco Use  . Smoking status: Former Research scientist (life sciences)  . Smokeless tobacco: Never Used  Substance Use Topics  . Alcohol use: No    Frequency: Never  . Drug use: No    Allergies  Allergen Reactions  . Codeine     'makes me feel funny'    Current Outpatient Medications  Medication Sig Dispense Refill  . allopurinol (ZYLOPRIM) 100 MG tablet TAKE 2 TABLETS BY MOUTH ONCE DAILY FOR 30 DAYS  2  . amLODipine (NORVASC) 10 MG tablet Take 10 mg by mouth  daily.    . atenolol (TENORMIN) 25 MG tablet Take 25 mg by mouth daily.    Marland Kitchen atorvastatin (LIPITOR) 20 MG tablet Take 1 tablet by mouth daily.  4  . famotidine (PEPCID) 20 MG tablet Take 1 tablet (20 mg total) by mouth daily. 30 tablet 1  . hydrALAZINE (APRESOLINE) 100 MG tablet Take 1 tablet by mouth 3 (three) times daily.  12  . losartan (COZAAR) 100 MG tablet Take 100 mg by mouth daily.    Marland Kitchen triamterene-hydrochlorothiazide (MAXZIDE) 75-50 MG tablet Take 1 tablet by mouth daily.     No current  facility-administered medications for this visit.       Review of Systems A complete review of systems was asked and was negative except for the following positive findings easy bruising and history of chronic kidney disease  Blood pressure (!) 145/70, pulse 74, temperature (!) 97.5 F (36.4 C), temperature source Skin, resp. rate 18, height 5\' 6"  (1.676 m), weight 222 lb (100.7 kg), SpO2 95 %.  Physical Exam CONSTITUTIONAL:  Pleasant, well-developed, well-nourished, and in no acute distress. EYES: Pupils equal and reactive to light, Sclera non-icteric EARS, NOSE, MOUTH AND THROAT:  The oropharynx was clear.  Dentition is absent.  Oral mucosa pink and moist. LYMPH NODES:  Lymph nodes in the neck and axillae were normal RESPIRATORY:  Lungs were clear and slightly diminished at the left base.  Normal respiratory effort without pathologic use of accessory muscles of respiration CARDIOVASCULAR: Heart was regular without murmurs.  There were no carotid bruits. GI: The abdomen was soft, nontender, and nondistended. There were no palpable masses. There was no hepatosplenomegaly. There were normal bowel sounds in all quadrants. GU:  Rectal deferred.   MUSCULOSKELETAL:  Normal muscle strength and tone.  No clubbing or cyanosis.   SKIN:  There were no pathologic skin lesions.  There were no nodules on palpation. NEUROLOGIC:  Sensation is normal.  Cranial nerves are grossly intact. PSYCH:  Oriented to person, place and time.  Mood and affect are normal.  Data Reviewed X-rays and CT scans  I have personally reviewed the patient's imaging, laboratory findings and medical records.    Assessment    Recurrent left-sided pleural effusion.  Diagnosis unclear.    Plan    I reviewed with her the indications and risks of left-sided thoracoscopy with pleural biopsy and talc pleurodesis.  We also discussed the potential role placing a Pleurx catheter at the same time.  I reviewed with her the risks of  bleeding, infection and recurrence.  She understands and would like [place] to proceed.  We will go ahead and set her up for a thoracoscopy possible thoracotomy with pleural biopsy and talc pleurodesis.       Nestor Lewandowsky, MD 09/19/2018, 10:29 AM

## 2018-09-21 ENCOUNTER — Telehealth: Payer: Self-pay

## 2018-09-21 NOTE — Telephone Encounter (Signed)
Spoke with Floydene Flock with [facility] and he will set up the patient's home care for Pleurx catheter when she has her surgery on 09/27/18.

## 2018-09-21 NOTE — Telephone Encounter (Signed)
Call to patient regarding pre admit testing.   The patient is aware to Pre-Admit on 09/23/18 at 10:30 am. Patient will check in at the [address] [facility], [address] (first floor).   The patient is aware to call the office should he/she have further questions.

## 2018-09-23 ENCOUNTER — Other Ambulatory Visit: Payer: Self-pay

## 2018-09-23 ENCOUNTER — Encounter
Admission: RE | Admit: 2018-09-23 | Discharge: 2018-09-23 | Disposition: A | Discharge status: Home / Self Care | Payer: PPO | Source: Ambulatory Visit | Attending: Cardiothoracic Surgery | Admitting: Cardiothoracic Surgery

## 2018-09-23 DIAGNOSIS — Z01812 Encounter for preprocedural laboratory examination: Secondary | ICD-10-CM | POA: Insufficient documentation

## 2018-09-23 HISTORY — DX: Anemia, unspecified: D64.9

## 2018-09-23 HISTORY — DX: Pleural effusion, not elsewhere classified: J90

## 2018-09-23 HISTORY — DX: Gastro-esophageal reflux disease without esophagitis: K21.9

## 2018-09-23 HISTORY — DX: Dyspnea, unspecified: R06.00

## 2018-09-23 LAB — PROTIME-INR
INR: 1 (ref 0.8–1.2)
Prothrombin Time: 12.9 seconds (ref 11.4–15.2)

## 2018-09-23 LAB — COMPREHENSIVE METABOLIC PANEL
ALK PHOS: 96 U/L (ref 38–126)
ALT: 14 U/L (ref 0–44)
ANION GAP: 10 (ref 5–15)
AST: 17 U/L (ref 15–41)
Albumin: 3.4 g/dL — ABNORMAL LOW (ref 3.5–5.0)
BUN: 23 mg/dL (ref 8–23)
CO2: 21 mmol/L — ABNORMAL LOW (ref 22–32)
Calcium: 9.6 mg/dL (ref 8.9–10.3)
Chloride: 104 mmol/L (ref 98–111)
Creatinine, Ser: 1.09 mg/dL — ABNORMAL HIGH (ref 0.44–1.00)
GFR calc Af Amer: >60 mL/min (ref >60)
GFR calc non Af Amer: 52 mL/min — ABNORMAL LOW (ref >60)
Glucose, Bld: 108 mg/dL — ABNORMAL HIGH (ref 70–99)
Potassium: 4 mmol/L (ref 3.5–5.1)
Sodium: 135 mmol/L (ref 135–145)
Total Bilirubin: 0.4 mg/dL (ref 0.3–1.2)
Total Protein: 7.1 g/dL (ref 6.5–8.1)

## 2018-09-23 LAB — CBC WITH DIFFERENTIAL/PLATELET
Abs Immature Granulocytes: 0.04 10*3/uL (ref 0.00–0.07)
Basophils Absolute: 0.1 10*3/uL (ref 0.0–0.1)
Basophils Relative: 1 %
Eosinophils Absolute: 0.1 10*3/uL (ref 0.0–0.5)
Eosinophils Relative: 2 %
HCT: 33.1 % — ABNORMAL LOW (ref 36.0–46.0)
Hemoglobin: 10.4 g/dL — ABNORMAL LOW (ref 12.0–15.0)
Immature Granulocytes: 1 %
Lymphocytes Relative: 30 %
Lymphs Abs: 2.5 10*3/uL (ref 0.7–4.0)
MCH: 29.3 pg (ref 26.0–34.0)
MCHC: 31.4 g/dL (ref 30.0–36.0)
MCV: 93.2 fL (ref 80.0–100.0)
Monocytes Absolute: 1 10*3/uL (ref 0.1–1.0)
Monocytes Relative: 13 %
Neutro Abs: 4.5 10*3/uL (ref 1.7–7.7)
Neutrophils Relative %: 53 %
Platelets: 281 10*3/uL (ref 150–400)
RBC: 3.55 MIL/uL — ABNORMAL LOW (ref 3.87–5.11)
RDW: 14.6 % (ref 11.5–15.5)
WBC: 8.2 10*3/uL (ref 4.0–10.5)
nRBC: 0 % (ref 0.0–0.2)

## 2018-09-23 LAB — APTT: aPTT: 31 seconds (ref 24–36)

## 2018-09-23 NOTE — Patient Instructions (Signed)
INSTRUCTIONS FOR SURGERY     Your surgery is scheduled for:  Tuesday, September 27, 2018      To find out your arrival time for the day of surgery,          please call [phone] between 1 pm and 3 pm on : Monday, September 26, 2018     When you arrive for surgery, report to the [facility].       Do NOT stop on the first floor to register.    REMEMBER: Instructions that are not followed completely may result in serious medical risk,  up to and including death, or upon the discretion of your surgeon and anesthesiologist,            your surgery may need to be rescheduled.  __X__ 1. Do not eat food after midnight the night before your procedure.                    No gum, candy, lozenger, tic tacs, tums or hard candies.                  ABSOLUTELY NOTHING SOLID IN YOUR MOUTH AFTER MIDNIGHT                    You may drink unlimited clear liquids up to 2 hours before you are scheduled                       to arrive for surgery. Do not drink anything within those 2 hours unless                      You need to take medicine, then take the smallest amount you need.                  Clear liquids include:  water, apple juice without pulp, any flavor Gatorade,                        Black coffee, black tea.  Sugar may be added but no dairy/ honey /lemon.                        Broth and jello is not considered a clear liquid.  __x__  2. On the morning of surgery, please brush your teeth with toothpaste and water.                    You may rinse with mouthwash if you wish but DO NOT SWALLOW TOOTHPASTE OR MOUTHWASH  __X___3. NO alcohol for 24 hours before or after surgery.  __x___ 4.  Do NOT smoke or use e-cigarettes for 24 HOURS PRIOR TO SURGERY.                      DO NOT Use any chewable tobacco products for at least 6 hours prior to surgery.  __x___ 5. If you start any new medication after this appointment and  prior to surgery, please  Bring it with you on the day of surgery.  ___x__ 6. Notify your doctor if there is any change in your medical condition, such as fever, infection, vomitting, diarrhea.  __x___ 7.  USE the CHG SOAP as instructed, the night before surgery and the day of surgery.                    Once you have washed with this soap, do NOT use any of the following:                      Powders, perfumes, lotions, make up, hairpins, clips or nail polish.                     You MAY  wear deodorant.                      Men may shave their face and neck.  Women need to shave 48 hours prior to surgery.                     DO NOT wear ANY jewelry on the day of surgery. If there are rings that are too tight to remove easily,                         please address this prior to the surgery day. Piercings need to be removed.   NO METAL ON YOUR BODY.                     Do NOT bring any valuables.  If you came to Pre-Admit testing then you will not need license, insurance card or credit card.                       If you will be staying overnight, please either leave your things in the car or have your family be responsible for this.                      [facility] IS NOT RESPONSIBLE FOR BELONGINGS OR VALUABLES.  ___X__ 8. DO NOT wear contact lenses on surgery day.  You may not have dentures,                     Hearing aides, contacts or glasses in the operating room. These items can be                    Placed in the Recovery Room to receive immediately after surgery.  __x___ 9. IF YOU ARE SCHEDULED TO GO HOME ON THE SAME DAY, YOU MUST                   Have someone to drive you home and to stay with you  for the first 24 hours.                    Have an arrangement prior to arriving on surgery day.  _____ 10. Take the following medications on the morning of surgery with a sip of water:                              1.HYDRALAZINE  2.AMLODIPINE                      3.PEPCID                     4.ALLOPURINOL                     5.                     6.  _____ 11.  Follow any instructions provided to you by your surgeon.                        Such as enema, clear liquid bowel prep  __X__  12. STOP COUMADIN / PLAVIX / ELIQUIS / ASPIRIN AS JK:DTOIZ                       THIS INCLUDES BC POWDERS / GOODIES POWDER  __x___ 13. STOP Anti-inflammatories as of: TODAY                      This includes IBUPROFEN / MOTRIN / ADVIL / ALEVE/ NAPROXYN                    YOU MAY TAKE TYLENOL ANY TIME PRIOR TO SURGERY.  _____ 14.  Stop supplements until after surgery.                     This includes:                  You may continue taking Vitamin B12 / Vitamin D3 but do not take on the                   Morning of surgery.  _____ 15. Bring your CPAP machine into preop with you on the morning of surgery.  ______16.  Stop Metformin 2 full days prior to surgery.  Stop on:N/A  ______17.  Continue to take the following medications but do not take on the morning                      Of surgery: TRIAMTERINE/HYDROCHLOROTHIAZIDE  ______18. If staying overnight, please have appropriate shoes to wear to be able to                      Walk around the unit.  MAKE SURE TO CONTINUE YOUR EVENING MEDICINES AS USUAL. THIS INCLUDES:     ATENOLOL/ ATORVASTATIN / HYDROXYCHLOR

## 2018-09-26 DIAGNOSIS — I10 Essential (primary) hypertension: Secondary | ICD-10-CM | POA: Diagnosis not present

## 2018-09-26 DIAGNOSIS — E872 Acidosis: Secondary | ICD-10-CM | POA: Diagnosis not present

## 2018-09-26 DIAGNOSIS — N183 Chronic kidney disease, stage 3 (moderate): Secondary | ICD-10-CM | POA: Diagnosis not present

## 2018-09-26 DIAGNOSIS — N2581 Secondary hyperparathyroidism of renal origin: Secondary | ICD-10-CM | POA: Diagnosis not present

## 2018-09-26 DIAGNOSIS — D631 Anemia in chronic kidney disease: Secondary | ICD-10-CM | POA: Diagnosis not present

## 2018-09-26 MED ORDER — CEFAZOLIN SODIUM-DEXTROSE 2-4 GM/100ML-% IV SOLN
2.0000 g | INTRAVENOUS | Status: AC
  Administered 2018-09-27: 2 g via INTRAVENOUS

## 2018-09-27 ENCOUNTER — Encounter
Admission: RE | Disposition: A | Discharge status: Home / Self Care | Payer: Self-pay | Source: Home / Self Care | Attending: Cardiothoracic Surgery

## 2018-09-27 ENCOUNTER — Encounter: Payer: Self-pay | Admitting: *Deleted

## 2018-09-27 ENCOUNTER — Inpatient Hospital Stay: Payer: PPO

## 2018-09-27 ENCOUNTER — Inpatient Hospital Stay
Admission: RE | Admit: 2018-09-27 | Discharge: 2018-09-30 | DRG: 167 | Disposition: A | Discharge status: Home / Self Care | Payer: PPO | Attending: Cardiothoracic Surgery | Admitting: Cardiothoracic Surgery

## 2018-09-27 ENCOUNTER — Other Ambulatory Visit: Payer: Self-pay

## 2018-09-27 DIAGNOSIS — N183 Chronic kidney disease, stage 3 (moderate): Secondary | ICD-10-CM | POA: Diagnosis not present

## 2018-09-27 DIAGNOSIS — I2510 Atherosclerotic heart disease of native coronary artery without angina pectoris: Secondary | ICD-10-CM | POA: Diagnosis present

## 2018-09-27 DIAGNOSIS — N2581 Secondary hyperparathyroidism of renal origin: Secondary | ICD-10-CM | POA: Diagnosis not present

## 2018-09-27 DIAGNOSIS — Z87891 Personal history of nicotine dependence: Secondary | ICD-10-CM | POA: Diagnosis not present

## 2018-09-27 DIAGNOSIS — M0600 Rheumatoid arthritis without rheumatoid factor, unspecified site: Secondary | ICD-10-CM | POA: Diagnosis present

## 2018-09-27 DIAGNOSIS — Z8249 Family history of ischemic heart disease and other diseases of the circulatory system: Secondary | ICD-10-CM | POA: Diagnosis not present

## 2018-09-27 DIAGNOSIS — K219 Gastro-esophageal reflux disease without esophagitis: Secondary | ICD-10-CM | POA: Diagnosis present

## 2018-09-27 DIAGNOSIS — I129 Hypertensive chronic kidney disease with stage 1 through stage 4 chronic kidney disease, or unspecified chronic kidney disease: Secondary | ICD-10-CM | POA: Diagnosis not present

## 2018-09-27 DIAGNOSIS — E669 Obesity, unspecified: Secondary | ICD-10-CM | POA: Diagnosis not present

## 2018-09-27 DIAGNOSIS — Z885 Allergy status to narcotic agent status: Secondary | ICD-10-CM

## 2018-09-27 DIAGNOSIS — J90 Pleural effusion, not elsewhere classified: Secondary | ICD-10-CM | POA: Diagnosis not present

## 2018-09-27 DIAGNOSIS — Z888 Allergy status to other drugs, medicaments and biological substances status: Secondary | ICD-10-CM

## 2018-09-27 DIAGNOSIS — Z4682 Encounter for fitting and adjustment of non-vascular catheter: Secondary | ICD-10-CM | POA: Diagnosis not present

## 2018-09-27 DIAGNOSIS — I700 Atherosclerosis of aorta: Secondary | ICD-10-CM | POA: Diagnosis not present

## 2018-09-27 DIAGNOSIS — Z79899 Other long term (current) drug therapy: Secondary | ICD-10-CM

## 2018-09-27 DIAGNOSIS — R091 Pleurisy: Secondary | ICD-10-CM | POA: Diagnosis not present

## 2018-09-27 DIAGNOSIS — Z09 Encounter for follow-up examination after completed treatment for conditions other than malignant neoplasm: Secondary | ICD-10-CM

## 2018-09-27 DIAGNOSIS — R700 Elevated erythrocyte sedimentation rate: Secondary | ICD-10-CM | POA: Diagnosis present

## 2018-09-27 DIAGNOSIS — E559 Vitamin D deficiency, unspecified: Secondary | ICD-10-CM | POA: Diagnosis not present

## 2018-09-27 DIAGNOSIS — E785 Hyperlipidemia, unspecified: Secondary | ICD-10-CM | POA: Diagnosis present

## 2018-09-27 DIAGNOSIS — Z6835 Body mass index (BMI) 35.0-35.9, adult: Secondary | ICD-10-CM

## 2018-09-27 DIAGNOSIS — M1A9XX0 Chronic gout, unspecified, without tophus (tophi): Secondary | ICD-10-CM | POA: Diagnosis not present

## 2018-09-27 HISTORY — PX: THORACOTOMY: SHX5074

## 2018-09-27 HISTORY — PX: CHEST TUBE INSERTION: SHX231

## 2018-09-27 LAB — BASIC METABOLIC PANEL
Anion gap: 10 (ref 5–15)
BUN: 33 mg/dL — ABNORMAL HIGH (ref 8–23)
CO2: 19 mmol/L — ABNORMAL LOW (ref 22–32)
Calcium: 9 mg/dL (ref 8.9–10.3)
Chloride: 105 mmol/L (ref 98–111)
Creatinine, Ser: 1.35 mg/dL — ABNORMAL HIGH (ref 0.44–1.00)
GFR calc Af Amer: 47 mL/min — ABNORMAL LOW (ref >60)
GFR calc non Af Amer: 41 mL/min — ABNORMAL LOW (ref >60)
Glucose, Bld: 141 mg/dL — ABNORMAL HIGH (ref 70–99)
Potassium: 4.1 mmol/L (ref 3.5–5.1)
Sodium: 134 mmol/L — ABNORMAL LOW (ref 135–145)

## 2018-09-27 SURGERY — THORACOTOMY, MAJOR
Anesthesia: General | Site: Chest | Laterality: Left

## 2018-09-27 MED ORDER — CEFAZOLIN SODIUM-DEXTROSE 2-4 GM/100ML-% IV SOLN
INTRAVENOUS | Status: AC
  Filled 2018-09-27: qty 100

## 2018-09-27 MED ORDER — BUPIVACAINE HCL (PF) 0.5 % IJ SOLN
INTRAMUSCULAR | Status: AC
  Filled 2018-09-27: qty 30

## 2018-09-27 MED ORDER — ROCURONIUM BROMIDE 50 MG/5ML IV SOLN
INTRAVENOUS | Status: AC
  Filled 2018-09-27: qty 1

## 2018-09-27 MED ORDER — ONDANSETRON HCL 4 MG/2ML IJ SOLN
INTRAMUSCULAR | Status: DC | PRN
  Administered 2018-09-27: 4 mg via INTRAVENOUS

## 2018-09-27 MED ORDER — BUPIVACAINE LIPOSOME 1.3 % IJ SUSP
INTRAMUSCULAR | Status: AC
  Filled 2018-09-27: qty 20

## 2018-09-27 MED ORDER — HYDRALAZINE HCL 50 MG PO TABS
100.0000 mg | ORAL_TABLET | Freq: Two times a day (BID) | ORAL | Status: DC
  Administered 2018-09-28 – 2018-09-29 (×2): 100 mg via ORAL
  Filled 2018-09-27 (×2): qty 2

## 2018-09-27 MED ORDER — ALBUTEROL SULFATE (2.5 MG/3ML) 0.083% IN NEBU
2.5000 mg | INHALATION_SOLUTION | RESPIRATORY_TRACT | Status: DC
  Administered 2018-09-27: 2.5 mg via RESPIRATORY_TRACT
  Filled 2018-09-27: qty 3

## 2018-09-27 MED ORDER — CHLORHEXIDINE GLUCONATE CLOTH 2 % EX PADS: 6.0000 | MEDICATED_PAD | Freq: Once | CUTANEOUS | Status: DC

## 2018-09-27 MED ORDER — BUPIVACAINE HCL (PF) 0.25 % IJ SOLN
INTRAMUSCULAR | Status: AC
  Filled 2018-09-27: qty 30

## 2018-09-27 MED ORDER — EPINEPHRINE PF 1 MG/ML IJ SOLN
INTRAMUSCULAR | Status: AC
  Filled 2018-09-27: qty 1

## 2018-09-27 MED ORDER — VASOPRESSIN 20 UNIT/ML IV SOLN
INTRAVENOUS | Status: DC | PRN
  Administered 2018-09-27 (×2): .5 [IU] via INTRAVENOUS
  Administered 2018-09-27 (×5): 1 [IU] via INTRAVENOUS

## 2018-09-27 MED ORDER — LOSARTAN POTASSIUM 50 MG PO TABS
100.0000 mg | ORAL_TABLET | Freq: Every day | ORAL | Status: DC
  Administered 2018-09-28 – 2018-09-30 (×3): 100 mg via ORAL
  Filled 2018-09-27 (×3): qty 2

## 2018-09-27 MED ORDER — ATENOLOL 25 MG PO TABS
25.0000 mg | ORAL_TABLET | Freq: Every day | ORAL | Status: DC
  Administered 2018-09-28 – 2018-09-30 (×3): 25 mg via ORAL
  Filled 2018-09-27 (×3): qty 1

## 2018-09-27 MED ORDER — MIDAZOLAM HCL 2 MG/2ML IJ SOLN
INTRAMUSCULAR | Status: DC | PRN
  Administered 2018-09-27: 1 mg via INTRAVENOUS

## 2018-09-27 MED ORDER — KETAMINE HCL 50 MG/ML IJ SOLN
INTRAMUSCULAR | Status: AC
  Filled 2018-09-27: qty 10

## 2018-09-27 MED ORDER — DEXAMETHASONE SODIUM PHOSPHATE 10 MG/ML IJ SOLN
INTRAMUSCULAR | Status: AC
  Filled 2018-09-27: qty 1

## 2018-09-27 MED ORDER — EPHEDRINE SULFATE 50 MG/ML IJ SOLN
INTRAMUSCULAR | Status: DC | PRN
  Administered 2018-09-27: 5 mg via INTRAVENOUS

## 2018-09-27 MED ORDER — DEXTROSE-NACL 5-0.45 % IV SOLN
INTRAVENOUS | Status: DC
  Administered 2018-09-27 – 2018-09-28 (×2): via INTRAVENOUS

## 2018-09-27 MED ORDER — VITAMIN D 25 MCG (1000 UNIT) PO TABS
2000.0000 [IU] | ORAL_TABLET | Freq: Every day | ORAL | Status: DC
  Administered 2018-09-28: 2000 [IU] via ORAL
  Filled 2018-09-27: qty 2

## 2018-09-27 MED ORDER — CEFAZOLIN SODIUM-DEXTROSE 2-4 GM/100ML-% IV SOLN
2.0000 g | Freq: Three times a day (TID) | INTRAVENOUS | Status: AC
  Administered 2018-09-27 – 2018-09-28 (×2): 2 g via INTRAVENOUS
  Filled 2018-09-27 (×2): qty 100

## 2018-09-27 MED ORDER — LACTATED RINGERS IV SOLN
INTRAVENOUS | Status: DC
  Administered 2018-09-27 (×2): via INTRAVENOUS

## 2018-09-27 MED ORDER — LIDOCAINE HCL (PF) 2 % IJ SOLN
INTRAMUSCULAR | Status: AC
  Filled 2018-09-27: qty 10

## 2018-09-27 MED ORDER — PHENYLEPHRINE HCL 10 MG/ML IJ SOLN
INTRAMUSCULAR | Status: DC | PRN
  Administered 2018-09-27: 100 ug via INTRAVENOUS
  Administered 2018-09-27: 200 ug via INTRAVENOUS
  Administered 2018-09-27: 100 ug via INTRAVENOUS

## 2018-09-27 MED ORDER — MORPHINE SULFATE (PF) 2 MG/ML IV SOLN: 1.0000 mg | INTRAVENOUS | Status: DC | PRN

## 2018-09-27 MED ORDER — ALBUTEROL SULFATE (2.5 MG/3ML) 0.083% IN NEBU
2.5000 mg | INHALATION_SOLUTION | RESPIRATORY_TRACT | Status: DC
  Administered 2018-09-28: 2.5 mg via RESPIRATORY_TRACT
  Filled 2018-09-27: qty 3

## 2018-09-27 MED ORDER — ROCURONIUM BROMIDE 100 MG/10ML IV SOLN
INTRAVENOUS | Status: DC | PRN
  Administered 2018-09-27: 50 mg via INTRAVENOUS

## 2018-09-27 MED ORDER — PROPOFOL 10 MG/ML IV BOLUS
INTRAVENOUS | Status: DC | PRN
  Administered 2018-09-27: 130 mg via INTRAVENOUS

## 2018-09-27 MED ORDER — FENTANYL CITRATE (PF) 250 MCG/5ML IJ SOLN
INTRAMUSCULAR | Status: AC
  Filled 2018-09-27: qty 5

## 2018-09-27 MED ORDER — SUCCINYLCHOLINE CHLORIDE 20 MG/ML IJ SOLN
INTRAMUSCULAR | Status: AC
  Filled 2018-09-27: qty 1

## 2018-09-27 MED ORDER — ONDANSETRON HCL 4 MG/2ML IJ SOLN
INTRAMUSCULAR | Status: AC
  Filled 2018-09-27: qty 2

## 2018-09-27 MED ORDER — FAMOTIDINE 20 MG PO TABS
20.0000 mg | ORAL_TABLET | Freq: Every day | ORAL | Status: DC
  Administered 2018-09-28 – 2018-09-30 (×3): 20 mg via ORAL
  Filled 2018-09-27 (×3): qty 1

## 2018-09-27 MED ORDER — ALLOPURINOL 300 MG PO TABS
300.0000 mg | ORAL_TABLET | Freq: Every day | ORAL | Status: DC
  Administered 2018-09-28 – 2018-09-30 (×3): 300 mg via ORAL
  Filled 2018-09-27: qty 3
  Filled 2018-09-27 (×2): qty 1
  Filled 2018-09-27: qty 3
  Filled 2018-09-27: qty 1

## 2018-09-27 MED ORDER — ONDANSETRON HCL 4 MG/2ML IJ SOLN: 4.0000 mg | Freq: Four times a day (QID) | INTRAMUSCULAR | Status: DC | PRN

## 2018-09-27 MED ORDER — VASOPRESSIN 20 UNIT/ML IV SOLN
INTRAVENOUS | Status: AC
  Filled 2018-09-27: qty 1

## 2018-09-27 MED ORDER — AMLODIPINE BESYLATE 10 MG PO TABS
10.0000 mg | ORAL_TABLET | Freq: Every day | ORAL | Status: DC
  Administered 2018-09-28 – 2018-09-30 (×3): 10 mg via ORAL
  Filled 2018-09-27 (×4): qty 1

## 2018-09-27 MED ORDER — TALC 5 G PL SUSR
INTRAPLEURAL | Status: DC | PRN
  Administered 2018-09-27: 4 g via INTRAPLEURAL

## 2018-09-27 MED ORDER — SUGAMMADEX SODIUM 200 MG/2ML IV SOLN
INTRAVENOUS | Status: AC
  Filled 2018-09-27: qty 2

## 2018-09-27 MED ORDER — ACETAMINOPHEN 500 MG PO TABS: 500.0000 mg | ORAL_TABLET | ORAL | Status: DC | PRN

## 2018-09-27 MED ORDER — MIDAZOLAM HCL 2 MG/2ML IJ SOLN
INTRAMUSCULAR | Status: AC
  Filled 2018-09-27: qty 2

## 2018-09-27 MED ORDER — SODIUM CHLORIDE 0.9 % IV SOLN
INTRAVENOUS | Status: DC | PRN
  Administered 2018-09-27: 40 ug/min via INTRAVENOUS

## 2018-09-27 MED ORDER — SEVOFLURANE IN SOLN
RESPIRATORY_TRACT | Status: AC
  Filled 2018-09-27: qty 250

## 2018-09-27 MED ORDER — BISACODYL 5 MG PO TBEC
10.0000 mg | DELAYED_RELEASE_TABLET | Freq: Every day | ORAL | Status: DC
  Administered 2018-09-27 – 2018-09-28 (×2): 10 mg via ORAL
  Filled 2018-09-27 (×2): qty 2

## 2018-09-27 MED ORDER — SUGAMMADEX SODIUM 200 MG/2ML IV SOLN
INTRAVENOUS | Status: DC | PRN
  Administered 2018-09-27: 199.6 mg via INTRAVENOUS

## 2018-09-27 MED ORDER — FENTANYL CITRATE (PF) 100 MCG/2ML IJ SOLN
INTRAMUSCULAR | Status: DC | PRN
  Administered 2018-09-27: 50 ug via INTRAVENOUS

## 2018-09-27 MED ORDER — LIDOCAINE HCL (CARDIAC) PF 100 MG/5ML IV SOSY
PREFILLED_SYRINGE | INTRAVENOUS | Status: DC | PRN
  Administered 2018-09-27: 50 mg via INTRAVENOUS

## 2018-09-27 MED ORDER — BUPIVACAINE HCL 0.25 % IJ SOLN
INTRAMUSCULAR | Status: DC | PRN
  Administered 2018-09-27: 30 mL

## 2018-09-27 MED ORDER — TALC (STERITALC) POWDER FOR INTRAPLEURAL USE
INTRAPLEURAL | Status: AC
  Filled 2018-09-27: qty 4

## 2018-09-27 MED ORDER — KETAMINE HCL 10 MG/ML IJ SOLN
INTRAMUSCULAR | Status: DC | PRN
  Administered 2018-09-27: 30 mg via INTRAVENOUS

## 2018-09-27 MED ORDER — ATORVASTATIN CALCIUM 20 MG PO TABS
40.0000 mg | ORAL_TABLET | Freq: Every day | ORAL | Status: DC
  Administered 2018-09-27 – 2018-09-29 (×3): 40 mg via ORAL
  Filled 2018-09-27 (×3): qty 2

## 2018-09-27 MED ORDER — SUCCINYLCHOLINE CHLORIDE 20 MG/ML IJ SOLN
INTRAMUSCULAR | Status: DC | PRN
  Administered 2018-09-27: 100 mg via INTRAVENOUS

## 2018-09-27 MED ORDER — TRAMADOL HCL 50 MG PO TABS
50.0000 mg | ORAL_TABLET | Freq: Four times a day (QID) | ORAL | Status: DC
  Administered 2018-09-27 – 2018-09-28 (×5): 50 mg via ORAL
  Administered 2018-09-29: 100 mg via ORAL
  Administered 2018-09-29 (×2): 50 mg via ORAL
  Filled 2018-09-27 (×4): qty 1
  Filled 2018-09-27: qty 2
  Filled 2018-09-27 (×3): qty 1

## 2018-09-27 MED ORDER — DEXAMETHASONE SODIUM PHOSPHATE 10 MG/ML IJ SOLN
INTRAMUSCULAR | Status: DC | PRN
  Administered 2018-09-27: 5 mg via INTRAVENOUS

## 2018-09-27 MED ORDER — HYDROMORPHONE HCL 1 MG/ML IJ SOLN
INTRAMUSCULAR | Status: AC
  Administered 2018-09-27: 0.25 mg via INTRAVENOUS
  Filled 2018-09-27: qty 1

## 2018-09-27 MED ORDER — HYDROMORPHONE HCL 1 MG/ML IJ SOLN
0.2500 mg | INTRAMUSCULAR | Status: DC | PRN
  Administered 2018-09-27 (×2): 0.25 mg via INTRAVENOUS

## 2018-09-27 MED ORDER — PROPOFOL 10 MG/ML IV BOLUS
INTRAVENOUS | Status: AC
  Filled 2018-09-27: qty 20

## 2018-09-27 SURGICAL SUPPLY — 92 items
BENZOIN TINCTURE PRP APPL 2/3 (GAUZE/BANDAGES/DRESSINGS) ×3 IMPLANT
BLADE SURG SZ11 CARB STEEL (BLADE) ×3 IMPLANT
BNDG COHESIVE 4X5 TAN STRL (GAUZE/BANDAGES/DRESSINGS) IMPLANT
BRONCHOSCOPE PED SLIM DISP (MISCELLANEOUS) ×3 IMPLANT
CANISTER SUCT 1200ML W/VALVE (MISCELLANEOUS) ×3 IMPLANT
CATH URET ROBINSON 16FR STRL (CATHETERS) ×2 IMPLANT
CHLORAPREP W/TINT 26 (MISCELLANEOUS) ×6 IMPLANT
CNTNR SPEC 2.5X3XGRAD LEK (MISCELLANEOUS) ×8
CONN REDUCER 1/4X3/8 STR (CONNECTOR) ×3
CONNECTOR REDUCER 1/4X3/8 STR (CONNECTOR) IMPLANT
CONT SPEC 4OZ STER OR WHT (MISCELLANEOUS) ×4
CONTAINER SPEC 2.5X3XGRAD LEK (MISCELLANEOUS) ×8 IMPLANT
CUTTER ECHEON FLEX ENDO 45 340 (ENDOMECHANICALS) IMPLANT
DEFOGGER SCOPE WARMER CLEARIFY (MISCELLANEOUS) ×1 IMPLANT
DRAIN CHANNEL 28F RND 3/8 FF (WOUND CARE) ×1 IMPLANT
DRAIN CHEST DRY SUCT SGL (MISCELLANEOUS) ×4 IMPLANT
DRAPE C-SECTION (MISCELLANEOUS) ×3 IMPLANT
DRAPE INCISE IOBAN 66X45 STRL (DRAPES) ×3 IMPLANT
DRAPE LAPAROTOMY 77X122 PED (DRAPES) ×3 IMPLANT
DRAPE MAG INST 16X20 L/F (DRAPES) ×3 IMPLANT
DRSG OPSITE POSTOP 3X4 (GAUZE/BANDAGES/DRESSINGS) ×1 IMPLANT
DRSG OPSITE POSTOP 4X6 (GAUZE/BANDAGES/DRESSINGS) ×2 IMPLANT
DRSG OPSITE POSTOP 4X8 (GAUZE/BANDAGES/DRESSINGS) ×2 IMPLANT
DRSG TELFA 3X8 NADH (GAUZE/BANDAGES/DRESSINGS) ×3 IMPLANT
ELECT BLADE 6.5 EXT (BLADE) ×3 IMPLANT
ELECT CAUTERY BLADE TIP 2.5 (TIP) ×3
ELECT REM PT RETURN 9FT ADLT (ELECTROSURGICAL) ×3
ELECTRODE CAUTERY BLDE TIP 2.5 (TIP) ×2 IMPLANT
ELECTRODE REM PT RTRN 9FT ADLT (ELECTROSURGICAL) ×2 IMPLANT
GAUZE SPONGE 4X4 12PLY STRL (GAUZE/BANDAGES/DRESSINGS) ×3 IMPLANT
GLOVE SURG SYN 7.5  E (GLOVE) ×2
GLOVE SURG SYN 7.5 E (GLOVE) ×4 IMPLANT
GLOVE SURG SYN 7.5 PF PI (GLOVE) ×4 IMPLANT
GOWN STRL REUS W/ TWL LRG LVL3 (GOWN DISPOSABLE) ×6 IMPLANT
GOWN STRL REUS W/TWL LRG LVL3 (GOWN DISPOSABLE) ×3
KIT PLEURX DRAIN CATH 15.5FR (DRAIN) ×3 IMPLANT
KIT TURNOVER KIT A (KITS) ×3 IMPLANT
LABEL OR SOLS (LABEL) ×3 IMPLANT
LOOP RED MAXI  1X406MM (MISCELLANEOUS)
LOOP VESSEL MAXI 1X406 RED (MISCELLANEOUS) ×2 IMPLANT
MARKER SKIN DUAL TIP RULER LAB (MISCELLANEOUS) ×6 IMPLANT
NDL FILTER BLUNT 18X1 1/2 (NEEDLE) ×2 IMPLANT
NDL SPNL 22GX3.5 QUINCKE BK (NEEDLE) ×2 IMPLANT
NEEDLE FILTER BLUNT 18X 1/2SAF (NEEDLE) ×1
NEEDLE FILTER BLUNT 18X1 1/2 (NEEDLE) ×2 IMPLANT
NEEDLE SPNL 22GX3.5 QUINCKE BK (NEEDLE) ×3 IMPLANT
PACK BASIN MAJOR ARMC (MISCELLANEOUS) ×3 IMPLANT
PACK BASIN MINOR ARMC (MISCELLANEOUS) ×3 IMPLANT
PAD DRESSING TELFA 3X8 NADH (GAUZE/BANDAGES/DRESSINGS) ×2 IMPLANT
RELOAD PROXIMATE TA60MM GREEN (ENDOMECHANICALS) IMPLANT
RELOAD STAPLE 35X2.5 WHT THIN (STAPLE) ×8 IMPLANT
RELOAD STAPLE 60 4.7 GRN THCK (ENDOMECHANICALS) IMPLANT
RELOAD STAPLER LINE PROX 30 GR (STAPLE) ×2 IMPLANT
SPONGE KITTNER 5P (MISCELLANEOUS) ×3 IMPLANT
STAPLE RELOAD 2.5MM WHITE (STAPLE) ×12 IMPLANT
STAPLER RELOAD LINE PROX 30 GR (STAPLE) ×3
STAPLER RELOADABLE 30 GRN THCK (STAPLE) ×2 IMPLANT
STAPLER SKIN PROX 35W (STAPLE) ×3 IMPLANT
STAPLER VASCULAR ECHELON 35 (CUTTER) IMPLANT
STRIP CLOSURE SKIN 1/2X4 (GAUZE/BANDAGES/DRESSINGS) ×4 IMPLANT
SUCTION FRAZIER HANDLE 10FR (MISCELLANEOUS) ×1
SUCTION TUBE FRAZIER 10FR DISP (MISCELLANEOUS) ×2 IMPLANT
SUT ETHILON 3-0 FS-10 30 BLK (SUTURE) ×3
SUT ETHILON 4-0 (SUTURE)
SUT ETHILON 4-0 FS2 18XMFL BLK (SUTURE)
SUT MNCRL AB 3-0 PS2 27 (SUTURE) IMPLANT
SUT PROLENE 5 0 RB 1 DA (SUTURE) IMPLANT
SUT SILK 0 (SUTURE) ×1
SUT SILK 0 30XBRD TIE 6 (SUTURE) ×2 IMPLANT
SUT SILK 1 SH (SUTURE) ×21 IMPLANT
SUT VIC AB 0 CT1 36 (SUTURE) ×6 IMPLANT
SUT VIC AB 0 SH 27 (SUTURE) ×3 IMPLANT
SUT VIC AB 2-0 CT1 27 (SUTURE) ×2
SUT VIC AB 2-0 CT1 TAPERPNT 27 (SUTURE) ×4 IMPLANT
SUT VIC AB 2-0 SH 27 (SUTURE) ×1
SUT VIC AB 2-0 SH 27XBRD (SUTURE) ×2 IMPLANT
SUT VIC AB 3-0 SH 27 (SUTURE)
SUT VIC AB 3-0 SH 27X BRD (SUTURE) IMPLANT
SUT VICRYL 2 TP 1 (SUTURE) ×9 IMPLANT
SUTURE EHLN 3-0 FS-10 30 BLK (SUTURE) ×2 IMPLANT
SUTURE ETHLN 4-0 FS2 18XMF BLK (SUTURE) IMPLANT
SYR 10ML SLIP (SYRINGE) ×3 IMPLANT
SYR 30ML LL (SYRINGE) ×5 IMPLANT
SYR BULB IRRIG 60ML STRL (SYRINGE) ×3 IMPLANT
TAPE CLOTH 3X10 WHT NS LF (GAUZE/BANDAGES/DRESSINGS) ×3 IMPLANT
TAPE TRANSPORE STRL 2 31045 (GAUZE/BANDAGES/DRESSINGS) IMPLANT
TRAY FOLEY MTR SLVR 16FR STAT (SET/KITS/TRAYS/PACK) ×3 IMPLANT
TROCAR FLEXIPATH 20X80 (ENDOMECHANICALS) ×1 IMPLANT
TROCAR FLEXIPATH THORACIC 15MM (ENDOMECHANICALS) IMPLANT
TUBING CONNECTING 10 (TUBING) ×4 IMPLANT
WATER STERILE IRR 1000ML POUR (IV SOLUTION) ×3 IMPLANT
YANKAUER SUCT BULB TIP FLEX NO (MISCELLANEOUS) ×3 IMPLANT

## 2018-09-27 NOTE — Anesthesia Procedure Notes (Signed)
Procedure Name: Intubation Date/Time: 09/27/2018 1:55 PM Performed by: Alphonsus Sias, MD Pre-anesthesia Checklist: Patient identified Patient Re-evaluated:Patient Re-evaluated prior to induction Oxygen Delivery Method: Circle system utilized Preoxygenation: Pre-oxygenation with 100% oxygen Induction Type: IV induction Ventilation: Mask ventilation without difficulty and Oral airway inserted - appropriate to patient size Laryngoscope Size: Mac and 3 Grade View: Grade I Tube type: Oral Endobronchial tube: Double lumen EBT, Left, EBT position confirmed by fiberoptic bronchoscope and EBT position confirmed by auscultation and 37 Fr Number of attempts: 1 Airway Equipment and Method: Stylet Placement Confirmation: ETT inserted through vocal cords under direct vision,  positive ETCO2 and breath sounds checked- equal and bilateral Tube secured with: Tape Dental Injury: Teeth and Oropharynx as per pre-operative assessment

## 2018-09-27 NOTE — Transfer of Care (Signed)
Immediate Anesthesia Transfer of Care Note  Patient: Pamela Young  Procedure(s) Performed: Bronchoscopy with pleural biopsy and talc pleurodesis (Left ) CHEST TUBE INSERTION (Left Chest)  Patient Location: PACU  Anesthesia Type:General  Level of Consciousness: awake, alert  and oriented  Airway & Oxygen Therapy: Patient Spontanous Breathing and Patient connected to nasal cannula oxygen  Post-op Assessment: Report given to RN and Post -op Vital signs reviewed and stable  Post vital signs: Reviewed and stable  Last Vitals:  Vitals Value Taken Time  BP 113/50 09/27/2018  4:02 PM  Temp    Pulse 78 09/27/2018  4:09 PM  Resp 18 09/27/2018  4:09 PM  SpO2 100 % 09/27/2018  4:09 PM  Vitals shown include unvalidated device data.  Last Pain:  Vitals:   09/27/18 1126  TempSrc: Oral  PainSc: 0-No pain         Complications: No apparent anesthesia complications

## 2018-09-27 NOTE — Anesthesia Procedure Notes (Deleted)
Performed by: Alphonsus Sias, MD

## 2018-09-27 NOTE — Anesthesia Preprocedure Evaluation (Addendum)
Anesthesia Evaluation  Patient identified by MRN, date of birth, ID band Patient awake    Reviewed: Allergy & Precautions, H&P , NPO status , Patient's Chart, lab work & pertinent test results  Airway Mallampati: II  TM Distance: >3 FB Neck ROM: full    Dental  (+) Upper Dentures, Lower Dentures   Pulmonary shortness of breath (related to pleural effusion), former smoker,     + decreased breath sounds (diminished breath sounds in all lung fields, L>R)      Cardiovascular hypertension, + CAD   Rhythm:regular Rate:Normal  Echo 08/12/18:  Normal LVF   Normal Wall Motion   EF=60%   Normal Right side. Normal study   Neuro/Psych negative neurological ROS  negative psych ROS   GI/Hepatic Neg liver ROS, GERD  ,  Endo/Other  negative endocrine ROS  Renal/GU CRFRenal disease     Musculoskeletal   Abdominal   Peds  Hematology  (+) Blood dyscrasia, anemia ,   Anesthesia Other Findings Obese  Past Medical History: No date: Anemia     Comment:  vitamin d3 deficiency No date: Arthritis     Comment:  rheumatoid arthritis 02/10/2018: Borderline abnormal TFTs     Comment:  Overview:  Borderline TSH/FT4, that have improved, but               has high TPO antibodies. 08/2018: Chronic bilateral pleural effusions 12/22/2017: Chronic gouty arthritis 01/27/2017: CKD (chronic kidney disease) stage 3, GFR 30-59 ml/min  (HCC)     Comment:  Overview:  Overview:  Being followed by Dr. Holley Raring               Overview:  Being followed by Dr. Holley Raring 02/17/2017: Coronary artery disease involving native coronary artery of  native heart     Comment:  Overview:  By ct scan  No date: Dyspnea     Comment:  prior to removal of fluids 12/10/2017: Elevated erythrocyte sedimentation rate No date: GERD (gastroesophageal reflux disease) No date: Hyperlipemia 05/31/2014: Hyperlipidemia No date: Hypertension 12/10/2017: Hyperuricemia 12/22/2017: Positive  anti-CCP test     Comment:  Last Assessment & Plan:  CCP+ at 46 Negative RF No date: Renal disorder 07/21/2015: Secondary hyperparathyroidism ([place])     Comment:  Overview:  Overview:  Followed by Dr. Holley Raring Overview:                Followed by Dr. Holley Raring 07/21/2015: Vitamin D deficiency     Comment:  Overview:  Overview:  Vit D 15 (checked thru nephrology,              who is supplementing w/ ergocalciferol). Overview:  Vit D              15 (checked thru nephrology, who is supplementing w/               ergocalciferol).  Past Surgical History: 2000: DILATION AND CURETTAGE OF UTERUS 08/2018: THORACENTESIS; Left No date: TUBAL LIGATION  BMI    Body Mass Index:  35.51 kg/m      Reproductive/Obstetrics negative OB ROS                            Anesthesia Physical Anesthesia Plan  ASA: III  Anesthesia Plan: General ETT   Post-op Pain Management:    Induction:   PONV Risk Score and Plan: Ondansetron, Dexamethasone, Midazolam and Treatment may vary due to age or medical condition  Airway Management  Planned: Double Lumen EBT  Additional Equipment:   Intra-op Plan:   Post-operative Plan:   Informed Consent: I have reviewed the patients History and Physical, chart, labs and discussed the procedure including the risks, benefits and alternatives for the proposed anesthesia with the patient or authorized representative who has indicated his/her understanding and acceptance.     Dental Advisory Given  Plan Discussed with: Anesthesiologist and CRNA  Anesthesia Plan Comments:         Anesthesia Quick Evaluation

## 2018-09-27 NOTE — Anesthesia Post-op Follow-up Note (Signed)
Anesthesia QCDR form completed.        

## 2018-09-27 NOTE — Op Note (Signed)
  09/27/2018  4:57 PM  PATIENT:  Pamela Young  68 y.o. female  PRE-OPERATIVE DIAGNOSIS: Recurrent left-sided pleural effusion  POST-OPERATIVE DIAGNOSIS: Recurrent left-sided pleural effusion  PROCEDURE: Preoperative bronchoscopy to assess endobronchial anatomy; left thoracoscopy with pleural biopsy; talc pleurodesis  SURGEON:  Surgeon(s) and Role:    Nestor Lewandowsky, MD - Primary  ASSISTANTS: Selena Batten Sponcellar PAS ANESTHESIA: General  INDICATIONS FOR PROCEDURE this patient is a 68 year old white female whose had multiple thoracenteses performed over the last several months for recurrent left-sided pleural effusion.  She has been extensively evaluated by our pulmonary medicine colleagues and found to be a suitable candidate for the above named procedure.  She had had 3 thoracenteses performed each time a specific diagnosis could not be made and she was offered the above named procedure for definitive diagnosis and treatment.  The indications and risks of the procedure were explained the patient gave her informed consent  DICTATION: Patient was brought to the operating suite and placed in the supine position.  General endotracheal anesthesia was given through a double-lumen tube.  Preoperative bronchoscopy was carried out.  This was normal to the subsegmental level.  There is no evidence of endobronchial tumor.  The endotracheal tube was positioned in the left mainstem bronchus and the patient was then turned for left thoracoscopy.  All pressure points were carefully padded.  The patient was prepped and draped in usual sterile fashion.  We began by making a 20 mm incision in the sixth intercostal space in the anterior axillary line.  Incision was deepened down through the muscles of the chest wall until the pleural space was entered.  Upon entering the pleural space we could see that there were some adhesions from the lung to the underside of the ribs.  Using a finger as a dissector we were able to  free up a sufficient space to place a thoracoscope.  Once this was done we could see that there were some adhesions from the upper lobe mostly to the ribs.  The lower lobe appeared more free and there was some free fluid in the left pleural space.  This was suctioned dry.  Complete thoracoscopy was carried out.  In order to make sufficient room for the thoracoscope I did take down some of the adhesions using a peanut dissector.  After I had sufficient pleural space open to be clear the diagnosis we then took multiple random biopsies of the pleura including some of the fibrinous exudate within the pleural space.  This was sent for permanent sectioning.  We also sent the fluid for cytology.  I did not send it for culture as this had been sent before.  I did not see anything in the pleural space to suggest a malignancy or an infection.  4 g of sterile talc were then insufflated under direct visualization coating the oral surface.  A 28 Blake chest tube was then positioned along the paravertebral space and brought out through a separate stab wound.  The thoracoscopy port was then closed with multiple layers running absorbable sutures with nylon on the skin.  The tube was secured with silk sutures.  The patient was then rolled in the supine position where she was taken to the recovery room in stable condition after extubation.  All sponge needle and instrument counts were correct as reported to me at the end of the case.   Nestor Lewandowsky, MD

## 2018-09-27 NOTE — Interval H&P Note (Signed)
History and Physical Interval Note:  09/27/2018 12:53 PM  Pamela Young  has presented today for surgery, with the diagnosis of PLEURAL EFFUSION.  The various methods of treatment have been discussed with the patient and family. After consideration of risks, benefits and other options for treatment, the patient has consented to  Procedure(s): Bronchoscopy with pleural biopsy and talc pleurodesis (Left) POSSIBLE PLEURX CATHETER INSERTION (Left) as a surgical intervention.  The patient's history has been reviewed, patient examined, no change in status, stable for surgery.  I have reviewed the patient's chart and labs.  Questions were answered to the patient's satisfaction.     Nestor Lewandowsky

## 2018-09-28 MED ORDER — ALBUTEROL SULFATE (2.5 MG/3ML) 0.083% IN NEBU
2.5000 mg | INHALATION_SOLUTION | Freq: Four times a day (QID) | RESPIRATORY_TRACT | Status: DC
  Administered 2018-09-28 – 2018-09-29 (×3): 2.5 mg via RESPIRATORY_TRACT
  Filled 2018-09-28 (×3): qty 3

## 2018-09-28 NOTE — Progress Notes (Signed)
Patient ID: Pamela Young, female   DOB: 1950-11-19, 69 y.o.   MRN: [id number]  Did well overnight.  Not short of breath.    CT draining minimal amount of serous drainage Dressing is clean and dry  No air leak  Urine output good and Foley removed.  Will place chest tube to water seal Repeat CXRay in the morning and remove tube if CXRay is OK.   [employer].

## 2018-09-28 NOTE — Progress Notes (Signed)
PT Cancellation Note  Patient Details Name: Pamela Young MRN: [id number] DOB: August 27, 1950   Cancelled Treatment:    Reason Eval/Treat Not Completed: Other (comment).  States she has been up with nursing helping her with chest tube, declines PT for now.  Ask her tomorrow to see if she has changed her mind, but if not then dc.   Pamela Young 09/28/2018, 6:12 PM  Mee Hives, PT MS Acute Rehab Dept. Number: [place] and [place]

## 2018-09-29 ENCOUNTER — Inpatient Hospital Stay: Payer: PPO

## 2018-09-29 LAB — BASIC METABOLIC PANEL
Anion gap: 11 (ref 5–15)
BUN: 28 mg/dL — ABNORMAL HIGH (ref 8–23)
CO2: 20 mmol/L — AB (ref 22–32)
Calcium: 9.1 mg/dL (ref 8.9–10.3)
Chloride: 103 mmol/L (ref 98–111)
Creatinine, Ser: 1.16 mg/dL — ABNORMAL HIGH (ref 0.44–1.00)
GFR calc Af Amer: 56 mL/min — ABNORMAL LOW (ref >60)
GFR calc non Af Amer: 49 mL/min — ABNORMAL LOW (ref >60)
Glucose, Bld: 122 mg/dL — ABNORMAL HIGH (ref 70–99)
Potassium: 3.6 mmol/L (ref 3.5–5.1)
Sodium: 134 mmol/L — ABNORMAL LOW (ref 135–145)

## 2018-09-29 LAB — CBC
HCT: 32.4 % — ABNORMAL LOW (ref 36.0–46.0)
Hemoglobin: 10.2 g/dL — ABNORMAL LOW (ref 12.0–15.0)
MCH: 29.1 pg (ref 26.0–34.0)
MCHC: 31.5 g/dL (ref 30.0–36.0)
MCV: 92.6 fL (ref 80.0–100.0)
Platelets: 227 10*3/uL (ref 150–400)
RBC: 3.5 MIL/uL — ABNORMAL LOW (ref 3.87–5.11)
RDW: 15 % (ref 11.5–15.5)
WBC: 15 10*3/uL — ABNORMAL HIGH (ref 4.0–10.5)
nRBC: 0 % (ref 0.0–0.2)

## 2018-09-29 LAB — CYTOLOGY - NON PAP

## 2018-09-29 LAB — SURGICAL PATHOLOGY

## 2018-09-29 MED ORDER — HYDRALAZINE HCL 50 MG PO TABS
50.0000 mg | ORAL_TABLET | Freq: Two times a day (BID) | ORAL | Status: DC
  Administered 2018-09-30: 50 mg via ORAL
  Filled 2018-09-29 (×2): qty 1

## 2018-09-29 NOTE — Progress Notes (Signed)
Patient ID: Pamela Young, female   DOB: 11/02/1950, 68 y.o.   MRN: [id number]  She did well overnight.  She has no specific complaints today.  There is no air leak from her chest tube.  There is only minimal drainage.  The pathology did not reveal any evidence of malignancy.  There is just nonspecific inflammation present.  We will repeat her labs today and also get another chest x-ray.  If the chest x-ray looks good we will remove her chest tube.  She will follow-up with me in 1 week.

## 2018-09-29 NOTE — Progress Notes (Signed)
PT Cancellation Note  Patient Details Name: Pamela Young MRN: [id number] DOB: [birth year]   Cancelled Treatment:    Reason Eval/Treat Not Completed: PT screened, no needs identified, will sign off. Patient ambulating with nursing, daughter. No PT needs.    Vail Vuncannon 09/29/2018, 11:15 AM

## 2018-09-30 ENCOUNTER — Inpatient Hospital Stay: Payer: PPO

## 2018-09-30 MED ORDER — TRAMADOL HCL 50 MG PO TABS: 50.0000 mg | ORAL_TABLET | Freq: Four times a day (QID) | ORAL | 0 refills | Status: DC

## 2018-09-30 NOTE — Care Management Important Message (Signed)
.  Important Message  Patient Details  Name: Pamela Young MRN: [id number] Date of Birth: [birth year]   Medicare Important Message Given:  Yes    Dannette Barbara 09/30/2018, 10:18 AM

## 2018-09-30 NOTE — Progress Notes (Signed)
Patient ID: Pamela Young, female   DOB: 1950-11-22, 68 y.o.   MRN: [id number]  She did well overnight without any significant problems.  Her blood pressures been under better control.  She denies any pain.  Her chest tube has had minimal drainage overnight.  There is been no air leak.  There is no change in the chest x-ray from this morning which was on suction compared to the chest x-ray yesterday which was on waterseal.  Her wounds are clean dry and intact.  I believe we can remove her chest tube today.  Her pathology did not reveal any evidence of malignancy.  The pleural fluid cytology also did not show any evidence of malignancy.  I will see her back again in 1 week in the office for suture removal.

## 2018-09-30 NOTE — Progress Notes (Signed)
Patient cleared for discharge. Chest tube removed by physician.      Education complete. AVS printed. Discharge instructions given. All questions answered for patient clarification.  Prescriptions given, pharmacy verified.  IV removed.  Discharged to home via POV

## 2018-09-30 NOTE — Anesthesia Postprocedure Evaluation (Signed)
Anesthesia Post Note  Patient: Pamela Young  Procedure(s) Performed: Bronchoscopy with pleural biopsy and talc pleurodesis (Left ) CHEST TUBE INSERTION (Left Chest)  Patient location during evaluation: PACU Anesthesia Type: General Level of consciousness: awake and alert Pain management: pain level controlled Vital Signs Assessment: post-procedure vital signs reviewed and stable Respiratory status: spontaneous breathing, nonlabored ventilation, respiratory function stable and patient connected to nasal cannula oxygen Cardiovascular status: blood pressure returned to baseline and stable Postop Assessment: no apparent nausea or vomiting Anesthetic complications: no     Last Vitals:  Vitals:   09/29/18 2026 09/30/18 0533  BP: (!) 115/47 (!) 136/56  Pulse: 88 92  Resp: 18 20  Temp: 37.2 C 37.2 C  SpO2: 92% 92%    Last Pain:  Vitals:   09/30/18 0533  TempSrc: Oral  PainSc:                  Molli Barrows

## 2018-10-02 DIAGNOSIS — J90 Pleural effusion, not elsewhere classified: Secondary | ICD-10-CM | POA: Diagnosis not present

## 2018-10-02 DIAGNOSIS — R7989 Other specified abnormal findings of blood chemistry: Secondary | ICD-10-CM | POA: Diagnosis not present

## 2018-10-02 DIAGNOSIS — R918 Other nonspecific abnormal finding of lung field: Secondary | ICD-10-CM | POA: Diagnosis not present

## 2018-10-02 DIAGNOSIS — R0609 Other forms of dyspnea: Secondary | ICD-10-CM | POA: Diagnosis not present

## 2018-10-02 DIAGNOSIS — J948 Other specified pleural conditions: Secondary | ICD-10-CM | POA: Diagnosis not present

## 2018-10-02 DIAGNOSIS — R0789 Other chest pain: Secondary | ICD-10-CM | POA: Diagnosis not present

## 2018-10-02 DIAGNOSIS — R05 Cough: Secondary | ICD-10-CM | POA: Diagnosis not present

## 2018-10-02 DIAGNOSIS — R0602 Shortness of breath: Secondary | ICD-10-CM | POA: Diagnosis not present

## 2018-10-02 DIAGNOSIS — R079 Chest pain, unspecified: Secondary | ICD-10-CM | POA: Diagnosis not present

## 2018-10-02 DIAGNOSIS — I129 Hypertensive chronic kidney disease with stage 1 through stage 4 chronic kidney disease, or unspecified chronic kidney disease: Secondary | ICD-10-CM | POA: Diagnosis not present

## 2018-10-02 DIAGNOSIS — R0600 Dyspnea, unspecified: Secondary | ICD-10-CM | POA: Diagnosis not present

## 2018-10-02 DIAGNOSIS — Z87891 Personal history of nicotine dependence: Secondary | ICD-10-CM | POA: Diagnosis not present

## 2018-10-02 DIAGNOSIS — N189 Chronic kidney disease, unspecified: Secondary | ICD-10-CM | POA: Diagnosis not present

## 2018-10-03 DIAGNOSIS — R918 Other nonspecific abnormal finding of lung field: Secondary | ICD-10-CM | POA: Diagnosis not present

## 2018-10-03 DIAGNOSIS — J90 Pleural effusion, not elsewhere classified: Secondary | ICD-10-CM | POA: Diagnosis not present

## 2018-10-03 DIAGNOSIS — R0609 Other forms of dyspnea: Secondary | ICD-10-CM | POA: Diagnosis not present

## 2018-10-03 NOTE — Discharge Summary (Signed)
[facility] SURGICAL DISCHARGE SUMMARY  Patient ID: Pamela Young MRN: [id number] DOB/AGE: 68-Nov-1952 68 y.o.  Admit date: 09/27/2018 Discharge date: 10/03/2018  Discharge Diagnoses Patient Active Problem List   Diagnosis Date Noted  . Encounter for long-term (current) use of high-risk medication 08/23/2018  . Pleural effusion 08/11/2018  . Respiratory distress 08/11/2018  . Seronegative rheumatoid arthritis ([place]) 03/07/2018  . Borderline abnormal TFTs 02/10/2018  . 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 Chronic gouty arthritis 12/22/2017  . Hyperuricemia 12/10/2017  . Elevated erythrocyte sedimentation rate 12/10/2017  . Coronary artery disease involving native coronary artery of native heart 02/17/2017  . Chest pain 01/28/2017  . Obesity, Class III, BMI 40-49.9 (morbid obesity) ([place]) 01/27/2017  . CKD (chronic kidney disease) stage 3, GFR 30-59 ml/min (HCC) 01/27/2017  . Vitamin D deficiency 07/21/2015  . Secondary hyperparathyroidism ([place]) 07/21/2015  . Hypertension 05/31/2014  . Hyperlipidemia 05/31/2014    Consultants None  Procedures 09/27/2018:  Preoperative bronchoscopy to assess endobronchial anatomy; left thoracoscopy with pleural biopsy; talc pleurodesis  HPI: Pamela Young is a 68 y.o. female with a history of recurrent left pleural effusions who presents to [facility] on 03[facility]10 for scheduled Preoperative bronchoscopy to assess endobronchial anatomy; left thoracoscopy with pleural biopsy; talc pleurodesis with Dr Genevive Bi.   Hospital Course: Informed consent was obtained and documented, and patient underwent uneventful Preoperative bronchoscopy to assess endobronchial anatomy; left thoracoscopy with pleural biopsy; talc pleurodesis (Dr Genevive Bi, 09/27/2018).  Post-operatively, patient's pain improved/resolved and advancement of patient's diet and ambulation were well-tolerated. The patients chest tube was removed on the day of discharge. The remainder of patient's  hospital course was essentially unremarkable, and discharge planning was initiated accordingly with patient safely able to be discharged home with appropriate discharge instructions, pain control, and outpatient follow-up after all of her and her family's questions were answered to their expressed satisfaction.   Discharge Condition: Good    Allergies as of 09/30/2018      Reactions   Ibuprofen Other (See Comments)   Can not take due to kidney disease   Codeine    'makes me feel funny'      Medication List    TAKE these medications   acetaminophen 500 MG tablet Commonly known as:  TYLENOL Take 500 mg by mouth every 6 (six) hours as needed.   allopurinol 300 MG tablet Commonly known as:  ZYLOPRIM Take 300 mg by mouth daily.   amLODipine 10 MG tablet Commonly known as:  NORVASC Take 10 mg by mouth daily.   atenolol 25 MG tablet Commonly known as:  TENORMIN Take 25 mg by mouth daily.   atorvastatin 40 MG tablet Commonly known as:  LIPITOR Take 1 tablet by mouth daily. Takes in evening   famotidine 20 MG tablet Commonly known as:  PEPCID Take 1 tablet (20 mg total) by mouth daily.   hydrALAZINE 100 MG tablet Commonly known as:  APRESOLINE Take 1 tablet by mouth 2 (two) times daily.   hydroxychloroquine 200 MG tablet Commonly known as:  PLAQUENIL Take by mouth daily. Takes in the evening for gout   losartan 100 MG tablet Commonly known as:  COZAAR Take 100 mg by mouth daily. Takes in the morning   traMADol 50 MG tablet Commonly known as:  ULTRAM Take 1-2 tablets (50-100 mg total) by mouth every 6 (six) hours.   triamterene-hydrochlorothiazide 75-50 MG tablet Commonly known as:  MAXZIDE Take 1 tablet by mouth daily.   Vitamin D3 25 MCG (1000 UT) Caps  Take 2,000 Int'l Units by mouth daily.        Follow-up Information    Nestor Lewandowsky, MD. Go on 10/07/2018.   Specialties:  Cardiothoracic Surgery, General Surgery Why:  Nurse visit and chest x-ray, Friday,  3/20 at 9 a.m. - go to [facility] (across the street) at 8:30 a.m. prior. [phone] Dr. Genevive Bi, Friday, March 27 at 8:30 a.m.   Contact information: [address] [place] [address] [place] , PA-C [facility]  10/03/2018, 7:06 AM [phone] M-F: 7am - 4pm

## 2018-10-04 ENCOUNTER — Other Ambulatory Visit: Payer: Self-pay

## 2018-10-04 ENCOUNTER — Encounter: Payer: Self-pay | Admitting: Surgery

## 2018-10-04 ENCOUNTER — Ambulatory Visit
Admission: RE | Admit: 2018-10-04 | Discharge: 2018-10-04 | Disposition: A | Discharge status: Home / Self Care | Payer: PPO | Source: Ambulatory Visit | Attending: Physician Assistant | Admitting: Physician Assistant

## 2018-10-04 ENCOUNTER — Ambulatory Visit (INDEPENDENT_AMBULATORY_CARE_PROVIDER_SITE_OTHER): Payer: PPO | Admitting: Surgery

## 2018-10-04 VITALS — BP 135/74 | HR 81 | Temp 97.2°F | Ht 66.0 in | Wt 220.6 lb

## 2018-10-04 DIAGNOSIS — E7800 Pure hypercholesterolemia, unspecified: Secondary | ICD-10-CM | POA: Diagnosis not present

## 2018-10-04 DIAGNOSIS — J90 Pleural effusion, not elsewhere classified: Secondary | ICD-10-CM | POA: Diagnosis not present

## 2018-10-04 DIAGNOSIS — Z4889 Encounter for other specified surgical aftercare: Secondary | ICD-10-CM

## 2018-10-04 DIAGNOSIS — J9811 Atelectasis: Secondary | ICD-10-CM | POA: Diagnosis not present

## 2018-10-04 DIAGNOSIS — I2510 Atherosclerotic heart disease of native coronary artery without angina pectoris: Secondary | ICD-10-CM | POA: Diagnosis not present

## 2018-10-04 DIAGNOSIS — N183 Chronic kidney disease, stage 3 (moderate): Secondary | ICD-10-CM | POA: Diagnosis not present

## 2018-10-04 DIAGNOSIS — I10 Essential (primary) hypertension: Secondary | ICD-10-CM | POA: Diagnosis not present

## 2018-10-04 NOTE — Progress Notes (Signed)
Surgical Clinic Progress/Follow-up Note   HPI:  68 y.o. Female presents to clinic today to review the results of her follow-up chest x-ray and for for removal of her Left thoracostomy tube suture, 7 Days s/p bronchoscopy, Left thoracoscopy with pleural biopsy and talc pleurodesis Genevive Bi, 09/27/2018). Patient reports unchanged chronic SOB with exertion and unchanged chronic phlegm, denies fever or worsening of SOB/cough.  Review of Systems:  Constitutional: denies fever/chills  Respiratory: shortness of breath as per interval history, denies wheezing Cardiovascular: denies chest pain, palpitations  Gastrointestinal: denies abdominal pain, N/V, or diarrhea Skin: Denies any other rashes or skin discolorations except post-surgical wounds as per interval history  Vital Signs:  BP 135/74   Pulse 81   Temp (!) 97.2 F (36.2 C) (Temporal)   Ht 5\' 6"  (1.676 m)   Wt 220 lb 9.6 oz (100.1 kg)   SpO2 96%   BMI 35.61 kg/m    Physical Exam:  Constitutional:  -- Normal body habitus  -- Awake, alert, and oriented x3  Pulmonary:  -- No crackles -- Equal breath sounds bilaterally -- Breathing non-labored at rest -- Left chest post-surgical wounds well-approximated without surrounding erythema or drainage, non-tender to palpation -- Left thoracostomy tube side well-approximated with a single silk suture well-secured without any surrounding erythema or drainage Cardiovascular:  -- S1, S2 present  -- No pericardial rubs  Musculoskeletal / Integumentary:  -- Wounds or skin discoloration: None appreciated except post-surgical incisions as described above (Respiratory) -- Extremities: B/L UE and LE FROM, hands and feet warm   Imaging:  Chest X-ray (10/04/2018) - personally reviewed and discussed with patient, her husband, and Dr. Genevive Bi 1. Interim removal of left chest tube. Stable loculated air collection noted the medial left chest and over the left lung base. Stable bibasilar atelectasis. Stable  small bilateral pleural effusions. 2.  Stable cardiomegaly.  No pulmonary venous congestion.  Assessment:  68 y.o. yo Female with a problem list including...  Patient Active Problem List   Diagnosis Date Noted  . Encounter for long-term (current) use of high-risk medication 08/23/2018  . Pleural effusion 08/11/2018  . Respiratory distress 08/11/2018  . Seronegative rheumatoid arthritis ([place]) 03/07/2018  . Borderline abnormal TFTs 02/10/2018  . 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 Chronic gouty arthritis 12/22/2017  . Hyperuricemia 12/10/2017  . Elevated erythrocyte sedimentation rate 12/10/2017  . Coronary artery disease involving native coronary artery of native heart 02/17/2017  . Chest pain 01/28/2017  . Obesity, Class III, BMI 40-49.9 (morbid obesity) ([place]) 01/27/2017  . CKD (chronic kidney disease) stage 3, GFR 30-59 ml/min (HCC) 01/27/2017  . Vitamin D deficiency 07/21/2015  . Secondary hyperparathyroidism ([place]) 07/21/2015  . Hypertension 05/31/2014  . Hyperlipidemia 05/31/2014    presents to clinic for subsequent post-op follow-up evaluation, doing okay without worsened Left pneumothorax,  Days s/p bronchoscopy, Left thoracoscopy with pleural biopsy and talc pleurodesis Genevive Bi, 09/27/2018).  Plan:              - Left chest tube suture removed             - chest x-ray reviewed and discussed with patient, her husband, and Dr. Genevive Bi             - return to clinic with Dr. Genevive Bi in 2 weeks, instructed to call office if any questions or concerns  All of the above recommendations were discussed with the patient and patient's family, and all of patient's and family's questions were answered to their expressed satisfaction.  --  Marilynne Drivers. Rosana Hoes, MD, [place]: [facility] - Partnering for exceptional care. Office: [phone]

## 2018-10-04 NOTE — Patient Instructions (Signed)
Patient will need to follow up with Dr.Oaks in 2 weeks.  Call the office with any questions or concerns.

## 2018-10-05 ENCOUNTER — Encounter: Payer: Self-pay | Admitting: Surgery

## 2018-10-07 ENCOUNTER — Ambulatory Visit: Payer: PPO

## 2018-10-13 ENCOUNTER — Ambulatory Visit
Admission: RE | Admit: 2018-10-13 | Discharge: 2018-10-13 | Disposition: A | Discharge status: Home / Self Care | Payer: PPO | Source: Ambulatory Visit | Attending: Cardiothoracic Surgery | Admitting: Cardiothoracic Surgery

## 2018-10-13 ENCOUNTER — Other Ambulatory Visit: Payer: Self-pay | Admitting: *Deleted

## 2018-10-13 DIAGNOSIS — J90 Pleural effusion, not elsewhere classified: Secondary | ICD-10-CM | POA: Diagnosis not present

## 2018-10-13 DIAGNOSIS — Z4889 Encounter for other specified surgical aftercare: Secondary | ICD-10-CM | POA: Insufficient documentation

## 2018-10-14 ENCOUNTER — Other Ambulatory Visit: Payer: Self-pay

## 2018-10-14 ENCOUNTER — Telehealth: Payer: Self-pay | Admitting: Internal Medicine

## 2018-10-14 ENCOUNTER — Telehealth: Payer: Self-pay

## 2018-10-14 ENCOUNTER — Ambulatory Visit (INDEPENDENT_AMBULATORY_CARE_PROVIDER_SITE_OTHER): Payer: PPO | Admitting: Cardiothoracic Surgery

## 2018-10-14 DIAGNOSIS — J90 Pleural effusion, not elsewhere classified: Secondary | ICD-10-CM

## 2018-10-14 LAB — FUNGUS CULTURE WITH STAIN

## 2018-10-14 LAB — FUNGUS CULTURE RESULT

## 2018-10-14 LAB — FUNGAL ORGANISM REFLEX

## 2018-10-14 NOTE — Telephone Encounter (Signed)
Spoke with Dr Mathis Fare office about following up with patient for her recurrent pleural effusion after being seen by Dr Genevive Bi. They will send a message back to his nurse to see about scheduling her a follow up appointment.

## 2018-10-14 NOTE — Progress Notes (Signed)
Virtual Visit via Telephone Note  I connected with Pamela Young on 10/14/18 at  8:30 AM EDT by telephone and verified that I am speaking with the correct person using two identifiers.   I discussed the limitations, risks, security and privacy concerns of performing an evaluation and management service by telephone and the availability of in person appointments. I also discussed with the patient that there may be a patient responsible charge related to this service. The patient expressed understanding and agreed to proceed.   History of Present Illness: Today I had the opportunity to call Mrs. Pamela Young and discussed with her by phone the results of her recent chest x-ray.  Pamela Young states that she has been doing well at home although she states that with exercise she breathes harder on her left side.  I attempted to try to elucidate what exactly that meant but she was unable to provide me with any further insight into her problem except that she feels like her left lung is less able to breathe on her right lung.  She has had an occasional cough with some sputum production.  She denies any fevers or chills.  She denies any pain.    Observations/Objective: I have independently reviewed her chest x-ray.  Chest x-ray shows a loculated hydropneumothorax on the left which is much smaller.  On the right side there is some trace to small pleural effusion.   Assessment and Plan: I had a long discussion with her regarding the results of the pathology which were nonspecific inflammatory condition.  I also reviewed with her the results of the chest x-ray.  I told her I would like her to follow-up with Dr. Renae Gloss day to figure out why she has a recurrent right-sided pleural effusion   Follow Up Instructions: I would like to see her back again in 1 month's time with a chest x-ray.  I would also like her to follow-up with Dr. Renae Gloss day.  She had an opportunity to have all her questions answered.    I discussed  the assessment and treatment plan with the patient. The patient was provided an opportunity to ask questions and all were answered. The patient agreed with the plan and demonstrated an understanding of the instructions.   The patient was advised to call back or seek an in-person evaluation if the symptoms worsen or if the condition fails to improve as anticipated.  I provided 15 minutes of non-face-to-face time during this encounter.   Nestor Lewandowsky, MD

## 2018-10-17 NOTE — Telephone Encounter (Signed)
Spoke to patient regarding referral from Dr. Genevive Bi. Set up with apt 11/15/18. Patient states she is short of breath and can only go short distances then she has to sit down. She feels like she did when she had fluid on her lung before. I will make Dr. Juanell Fairly aware.

## 2018-10-20 ENCOUNTER — Inpatient Hospital Stay: Payer: PPO

## 2018-10-20 ENCOUNTER — Telehealth (INDEPENDENT_AMBULATORY_CARE_PROVIDER_SITE_OTHER): Payer: PPO | Admitting: Internal Medicine

## 2018-10-20 ENCOUNTER — Telehealth: Payer: Self-pay | Admitting: *Deleted

## 2018-10-20 ENCOUNTER — Other Ambulatory Visit: Payer: Self-pay

## 2018-10-20 ENCOUNTER — Emergency Department: Payer: PPO

## 2018-10-20 ENCOUNTER — Inpatient Hospital Stay
Admission: EM | Admit: 2018-10-20 | Discharge: 2018-10-21 | DRG: 187 | Disposition: A | Discharge status: Home / Self Care | Payer: PPO | Attending: Internal Medicine | Admitting: Internal Medicine

## 2018-10-20 ENCOUNTER — Ambulatory Visit: Payer: PPO

## 2018-10-20 DIAGNOSIS — J90 Pleural effusion, not elsewhere classified: Secondary | ICD-10-CM | POA: Diagnosis not present

## 2018-10-20 DIAGNOSIS — N951 Menopausal and female climacteric states: Secondary | ICD-10-CM | POA: Diagnosis not present

## 2018-10-20 DIAGNOSIS — I2510 Atherosclerotic heart disease of native coronary artery without angina pectoris: Secondary | ICD-10-CM | POA: Diagnosis not present

## 2018-10-20 DIAGNOSIS — J918 Pleural effusion in other conditions classified elsewhere: Secondary | ICD-10-CM | POA: Diagnosis not present

## 2018-10-20 DIAGNOSIS — M109 Gout, unspecified: Secondary | ICD-10-CM | POA: Diagnosis present

## 2018-10-20 DIAGNOSIS — E871 Hypo-osmolality and hyponatremia: Secondary | ICD-10-CM | POA: Diagnosis not present

## 2018-10-20 DIAGNOSIS — G8911 Acute pain due to trauma: Secondary | ICD-10-CM | POA: Diagnosis not present

## 2018-10-20 DIAGNOSIS — K219 Gastro-esophageal reflux disease without esophagitis: Secondary | ICD-10-CM | POA: Diagnosis not present

## 2018-10-20 DIAGNOSIS — Z6829 Body mass index (BMI) 29.0-29.9, adult: Secondary | ICD-10-CM | POA: Diagnosis not present

## 2018-10-20 DIAGNOSIS — G5640 Causalgia of unspecified upper limb: Secondary | ICD-10-CM | POA: Diagnosis not present

## 2018-10-20 DIAGNOSIS — Z6833 Body mass index (BMI) 33.0-33.9, adult: Secondary | ICD-10-CM | POA: Diagnosis not present

## 2018-10-20 DIAGNOSIS — E669 Obesity, unspecified: Secondary | ICD-10-CM | POA: Diagnosis present

## 2018-10-20 DIAGNOSIS — I129 Hypertensive chronic kidney disease with stage 1 through stage 4 chronic kidney disease, or unspecified chronic kidney disease: Secondary | ICD-10-CM | POA: Diagnosis not present

## 2018-10-20 DIAGNOSIS — R0600 Dyspnea, unspecified: Secondary | ICD-10-CM | POA: Diagnosis not present

## 2018-10-20 DIAGNOSIS — M0600 Rheumatoid arthritis without rheumatoid factor, unspecified site: Secondary | ICD-10-CM | POA: Diagnosis present

## 2018-10-20 DIAGNOSIS — Z01812 Encounter for preprocedural laboratory examination: Secondary | ICD-10-CM | POA: Diagnosis not present

## 2018-10-20 DIAGNOSIS — E7800 Pure hypercholesterolemia, unspecified: Secondary | ICD-10-CM | POA: Diagnosis not present

## 2018-10-20 DIAGNOSIS — E559 Vitamin D deficiency, unspecified: Secondary | ICD-10-CM | POA: Diagnosis present

## 2018-10-20 DIAGNOSIS — I4819 Other persistent atrial fibrillation: Secondary | ICD-10-CM | POA: Diagnosis not present

## 2018-10-20 DIAGNOSIS — R12 Heartburn: Secondary | ICD-10-CM | POA: Diagnosis not present

## 2018-10-20 DIAGNOSIS — E785 Hyperlipidemia, unspecified: Secondary | ICD-10-CM | POA: Diagnosis not present

## 2018-10-20 DIAGNOSIS — Z0000 Encounter for general adult medical examination without abnormal findings: Secondary | ICD-10-CM | POA: Diagnosis not present

## 2018-10-20 DIAGNOSIS — N2581 Secondary hyperparathyroidism of renal origin: Secondary | ICD-10-CM | POA: Diagnosis not present

## 2018-10-20 DIAGNOSIS — M069 Rheumatoid arthritis, unspecified: Secondary | ICD-10-CM | POA: Diagnosis present

## 2018-10-20 DIAGNOSIS — R0603 Acute respiratory distress: Secondary | ICD-10-CM | POA: Diagnosis not present

## 2018-10-20 DIAGNOSIS — R0602 Shortness of breath: Secondary | ICD-10-CM

## 2018-10-20 DIAGNOSIS — Z87891 Personal history of nicotine dependence: Secondary | ICD-10-CM | POA: Diagnosis not present

## 2018-10-20 DIAGNOSIS — Z01810 Encounter for preprocedural cardiovascular examination: Secondary | ICD-10-CM | POA: Diagnosis not present

## 2018-10-20 DIAGNOSIS — E282 Polycystic ovarian syndrome: Secondary | ICD-10-CM | POA: Diagnosis not present

## 2018-10-20 DIAGNOSIS — Z882 Allergy status to sulfonamides status: Secondary | ICD-10-CM | POA: Diagnosis not present

## 2018-10-20 DIAGNOSIS — I25810 Atherosclerosis of coronary artery bypass graft(s) without angina pectoris: Secondary | ICD-10-CM | POA: Diagnosis not present

## 2018-10-20 DIAGNOSIS — K589 Irritable bowel syndrome without diarrhea: Secondary | ICD-10-CM | POA: Diagnosis not present

## 2018-10-20 DIAGNOSIS — Z124 Encounter for screening for malignant neoplasm of cervix: Secondary | ICD-10-CM | POA: Diagnosis not present

## 2018-10-20 DIAGNOSIS — N3289 Other specified disorders of bladder: Secondary | ICD-10-CM | POA: Diagnosis not present

## 2018-10-20 DIAGNOSIS — I313 Pericardial effusion (noninflammatory): Secondary | ICD-10-CM | POA: Diagnosis not present

## 2018-10-20 DIAGNOSIS — Z79899 Other long term (current) drug therapy: Secondary | ICD-10-CM | POA: Diagnosis not present

## 2018-10-20 DIAGNOSIS — Z20828 Contact with and (suspected) exposure to other viral communicable diseases: Secondary | ICD-10-CM | POA: Diagnosis not present

## 2018-10-20 DIAGNOSIS — E039 Hypothyroidism, unspecified: Secondary | ICD-10-CM | POA: Diagnosis not present

## 2018-10-20 DIAGNOSIS — N183 Chronic kidney disease, stage 3 (moderate): Secondary | ICD-10-CM | POA: Diagnosis present

## 2018-10-20 DIAGNOSIS — E538 Deficiency of other specified B group vitamins: Secondary | ICD-10-CM | POA: Diagnosis not present

## 2018-10-20 DIAGNOSIS — I10 Essential (primary) hypertension: Secondary | ICD-10-CM | POA: Diagnosis not present

## 2018-10-20 DIAGNOSIS — Z9889 Other specified postprocedural states: Secondary | ICD-10-CM | POA: Diagnosis not present

## 2018-10-20 DIAGNOSIS — F341 Dysthymic disorder: Secondary | ICD-10-CM | POA: Diagnosis not present

## 2018-10-20 DIAGNOSIS — I484 Atypical atrial flutter: Secondary | ICD-10-CM | POA: Diagnosis not present

## 2018-10-20 DIAGNOSIS — J302 Other seasonal allergic rhinitis: Secondary | ICD-10-CM | POA: Diagnosis not present

## 2018-10-20 DIAGNOSIS — Z1231 Encounter for screening mammogram for malignant neoplasm of breast: Secondary | ICD-10-CM | POA: Diagnosis not present

## 2018-10-20 DIAGNOSIS — E1129 Type 2 diabetes mellitus with other diabetic kidney complication: Secondary | ICD-10-CM | POA: Diagnosis not present

## 2018-10-20 DIAGNOSIS — Z7901 Long term (current) use of anticoagulants: Secondary | ICD-10-CM | POA: Diagnosis not present

## 2018-10-20 DIAGNOSIS — I110 Hypertensive heart disease with heart failure: Secondary | ICD-10-CM | POA: Diagnosis not present

## 2018-10-20 DIAGNOSIS — M79652 Pain in left thigh: Secondary | ICD-10-CM | POA: Diagnosis not present

## 2018-10-20 DIAGNOSIS — Z7984 Long term (current) use of oral hypoglycemic drugs: Secondary | ICD-10-CM | POA: Diagnosis not present

## 2018-10-20 DIAGNOSIS — E119 Type 2 diabetes mellitus without complications: Secondary | ICD-10-CM | POA: Diagnosis not present

## 2018-10-20 DIAGNOSIS — I5042 Chronic combined systolic (congestive) and diastolic (congestive) heart failure: Secondary | ICD-10-CM | POA: Diagnosis not present

## 2018-10-20 DIAGNOSIS — I495 Sick sinus syndrome: Secondary | ICD-10-CM | POA: Diagnosis not present

## 2018-10-20 DIAGNOSIS — S39012A Strain of muscle, fascia and tendon of lower back, initial encounter: Secondary | ICD-10-CM | POA: Diagnosis not present

## 2018-10-20 LAB — CBC
HCT: 36.6 % (ref 36.0–46.0)
Hemoglobin: 11.4 g/dL — ABNORMAL LOW (ref 12.0–15.0)
MCH: 28.1 pg (ref 26.0–34.0)
MCHC: 31.1 g/dL (ref 30.0–36.0)
MCV: 90.1 fL (ref 80.0–100.0)
Platelets: 496 10*3/uL — ABNORMAL HIGH (ref 150–400)
RBC: 4.06 MIL/uL (ref 3.87–5.11)
RDW: 14.7 % (ref 11.5–15.5)
WBC: 14.3 10*3/uL — ABNORMAL HIGH (ref 4.0–10.5)
nRBC: 0 % (ref 0.0–0.2)

## 2018-10-20 LAB — PROTEIN, PLEURAL OR PERITONEAL FLUID: Total protein, fluid: 4.2 g/dL

## 2018-10-20 LAB — BODY FLUID CELL COUNT WITH DIFFERENTIAL
Eos, Fluid: 27 %
Lymphs, Fluid: 31 %
Monocyte-Macrophage-Serous Fluid: 23 %
Neutrophil Count, Fluid: 17 %
Other Cells, Fluid: 2 %
Total Nucleated Cell Count, Fluid: 2005 cu mm

## 2018-10-20 LAB — LACTATE DEHYDROGENASE, PLEURAL OR PERITONEAL FLUID: LD, Fluid: 274 U/L — ABNORMAL HIGH (ref 3–23)

## 2018-10-20 LAB — COMPREHENSIVE METABOLIC PANEL
ALT: 14 U/L (ref 0–44)
AST: 21 U/L (ref 15–41)
Albumin: 3.1 g/dL — ABNORMAL LOW (ref 3.5–5.0)
Alkaline Phosphatase: 131 U/L — ABNORMAL HIGH (ref 38–126)
Anion gap: 12 (ref 5–15)
BUN: 22 mg/dL (ref 8–23)
CO2: 20 mmol/L — ABNORMAL LOW (ref 22–32)
Calcium: 9.4 mg/dL (ref 8.9–10.3)
Chloride: 100 mmol/L (ref 98–111)
Creatinine, Ser: 1.65 mg/dL — ABNORMAL HIGH (ref 0.44–1.00)
GFR calc Af Amer: 37 mL/min — ABNORMAL LOW (ref >60)
GFR calc non Af Amer: 32 mL/min — ABNORMAL LOW (ref >60)
Glucose, Bld: 136 mg/dL — ABNORMAL HIGH (ref 70–99)
Potassium: 4.5 mmol/L (ref 3.5–5.1)
Sodium: 132 mmol/L — ABNORMAL LOW (ref 135–145)
Total Bilirubin: 0.7 mg/dL (ref 0.3–1.2)
Total Protein: 7.7 g/dL (ref 6.5–8.1)

## 2018-10-20 LAB — GLUCOSE, PLEURAL OR PERITONEAL FLUID: Glucose, Fluid: 116 mg/dL

## 2018-10-20 LAB — TROPONIN I: Troponin I: 0.03 ng/mL (ref <0.03)

## 2018-10-20 LAB — BRAIN NATRIURETIC PEPTIDE: B Natriuretic Peptide: 94 pg/mL (ref 0.0–100.0)

## 2018-10-20 MED ORDER — ACETAMINOPHEN 325 MG PO TABS: 650.0000 mg | ORAL_TABLET | Freq: Four times a day (QID) | ORAL | Status: DC | PRN

## 2018-10-20 MED ORDER — ONDANSETRON HCL 4 MG PO TABS: 4.0000 mg | ORAL_TABLET | Freq: Four times a day (QID) | ORAL | Status: DC | PRN

## 2018-10-20 MED ORDER — HYDROXYCHLOROQUINE SULFATE 200 MG PO TABS
200.0000 mg | ORAL_TABLET | Freq: Every day | ORAL | Status: DC
  Filled 2018-10-20 (×2): qty 1

## 2018-10-20 MED ORDER — ACETAMINOPHEN 500 MG PO TABS: 500.0000 mg | ORAL_TABLET | Freq: Four times a day (QID) | ORAL | Status: DC | PRN

## 2018-10-20 MED ORDER — ACETAMINOPHEN 650 MG RE SUPP
650.0000 mg | Freq: Four times a day (QID) | RECTAL | Status: DC | PRN
  Filled 2018-10-20: qty 1

## 2018-10-20 MED ORDER — AZITHROMYCIN 500 MG PO TABS
250.0000 mg | ORAL_TABLET | Freq: Every day | ORAL | Status: DC
  Administered 2018-10-21: 250 mg via ORAL
  Filled 2018-10-20: qty 1

## 2018-10-20 MED ORDER — ATORVASTATIN CALCIUM 20 MG PO TABS: 40.0000 mg | ORAL_TABLET | Freq: Every day | ORAL | Status: DC

## 2018-10-20 MED ORDER — SODIUM CHLORIDE 0.9 % IV SOLN
2.0000 g | INTRAVENOUS | Status: DC
  Administered 2018-10-20: 2 g via INTRAVENOUS
  Filled 2018-10-20: qty 2
  Filled 2018-10-20: qty 20

## 2018-10-20 MED ORDER — AZITHROMYCIN 500 MG PO TABS
500.0000 mg | ORAL_TABLET | Freq: Every day | ORAL | Status: AC
  Administered 2018-10-20: 500 mg via ORAL
  Filled 2018-10-20: qty 1

## 2018-10-20 MED ORDER — VITAMIN D 25 MCG (1000 UNIT) PO TABS
2000.0000 [IU] | ORAL_TABLET | Freq: Every day | ORAL | Status: DC
  Administered 2018-10-21: 2000 [IU] via ORAL
  Filled 2018-10-20: qty 2

## 2018-10-20 MED ORDER — TRAMADOL HCL 50 MG PO TABS: 50.0000 mg | ORAL_TABLET | Freq: Four times a day (QID) | ORAL | Status: DC | PRN

## 2018-10-20 MED ORDER — FAMOTIDINE 20 MG PO TABS
20.0000 mg | ORAL_TABLET | Freq: Every day | ORAL | Status: DC
  Administered 2018-10-21: 10:00:00 20 mg via ORAL
  Filled 2018-10-20: qty 1

## 2018-10-20 MED ORDER — ENOXAPARIN SODIUM 40 MG/0.4ML ~~LOC~~ SOLN
40.0000 mg | SUBCUTANEOUS | Status: DC
  Administered 2018-10-21: 40 mg via SUBCUTANEOUS
  Filled 2018-10-20: qty 0.4

## 2018-10-20 MED ORDER — LOSARTAN POTASSIUM 50 MG PO TABS: 50.0000 mg | ORAL_TABLET | Freq: Every day | ORAL | Status: DC

## 2018-10-20 MED ORDER — ALLOPURINOL 300 MG PO TABS
300.0000 mg | ORAL_TABLET | Freq: Every day | ORAL | Status: DC
  Administered 2018-10-21: 10:00:00 300 mg via ORAL
  Filled 2018-10-20: qty 3
  Filled 2018-10-20: qty 1

## 2018-10-20 MED ORDER — AMLODIPINE BESYLATE 10 MG PO TABS
10.0000 mg | ORAL_TABLET | Freq: Every day | ORAL | Status: DC
  Administered 2018-10-21: 10 mg via ORAL
  Filled 2018-10-20: qty 1

## 2018-10-20 MED ORDER — ONDANSETRON HCL 4 MG/2ML IJ SOLN: 4.0000 mg | Freq: Four times a day (QID) | INTRAMUSCULAR | Status: DC | PRN

## 2018-10-20 NOTE — Progress Notes (Signed)
VIDEO/TELEPHONE VISIT    In the setting of the current Covid19 crisis, you are scheduled for a (video) visit with me 10/20/2018  Just as we do with many in-office visits, in order for you to participate in this visit, we must obtain consent.   I can obtain your verbal consent now.  PATIENT AGREES AND CONFIRMS -YES This Visit has Audio and Visual Capabilities for optimal patient care experience   Evaluation Performed:  Follow-up visit  This visit type was conducted due to national recommendations for restrictions regarding the COVID-19 Pandemic (e.g. social distancing).  This format is felt to be most appropriate for this patient at this time.  All issues noted in this document were discussed and addressed.  No physical exam was performed (except for noted visual exam findings with Telehealth visits).  See MyChart message from today for the patient's consent to telehealth for [place]     Date:  10/20/2018   ID:  Rashaun Curl, DOB 11/09/1950, MRN [id number]  Patient Location:  [place] [address]   Provider location:   [place]  PCP:  Sallee Lange, NP   Chief Complaint:      History of Present Illness:    Jazzalynn Rhudy is a 68 y.o. female who presents via audio/video conferencing for a telehealth visit today.   The patient does not symptoms concerning for COVID-19 infection (fever, chills, cough, or new SHORTNESS OF BREATH).   Progressive SOB and increased WOB No fevers Previous h/o hydropneumotorax H/o left sided pleural effusion    Prior RADIOLOGY STUDIES  The following studies were reviewed today: 3.26 RT sided effusion    No outpatient medications have been marked as taking for the 10/20/18 encounter (Telemedicine) with Flora Lipps, MD.     Allergies:   Ibuprofen and Codeine   Social History   Tobacco Use  . Smoking status: Former Smoker    Packs/day: 1.00    Types: Cigarettes    Last  attempt to quit: 2008    Years since quitting: 12.2  . Smokeless tobacco: Never Used  Substance Use Topics  . Alcohol use: No    Frequency: Never  . Drug use: No     Review of Systems:  Gen:  Denies  fever, sweats, chills weigh loss  HEENT: Denies blurred vision, double vision, ear pain, eye pain, hearing loss, nose bleeds, sore throat Cardiac:  No dizziness, chest pain or heaviness, chest tightness,edema, No JVD Resp:   No cough, -sputum production, +shortness of breath,-wheezing, -hemoptysis,  Gi: Denies swallowing difficulty, stomach pain, nausea or vomiting, diarrhea, constipation, bowel incontinence Gu:  Denies bladder incontinence, burning urine Ext:   Denies Joint pain, stiffness or swelling Skin: Denies  skin rash, easy bruising or bleeding or hives Endoc:  Denies polyuria, polydipsia , polyphagia or weight change Psych:   Denies depression, insomnia or hallucinations  Other:  All other systems negative       Labs/Other Tests and Data Reviewed:    Recent Labs: 08/10/2018: B Natriuretic Peptide 32.0; TSH 3.124 09/23/2018: ALT 14 09/29/2018: BUN 28; Creatinine, Ser 1.16; Hemoglobin 10.2; Platelets 227; Potassium 3.6; Sodium 134      ASSESSMENT & PLAN:    Patients needs immediate assement for progressive SOB and increase WOB likely recurrent pleural effusion Will need assessment for hypoxia and CT chest for further assessment Patient advised to go to ER    COVID-19 Education: The signs and symptoms  of COVID-19 were discussed with the patient and how to seek care for testing (follow up with PCP or arrange E-visit).  The importance of social distancing was discussed today.  Patient Risk:   After full review of this patients clinical status, I feel that they are at least moderate risk at this time.  Time:   Today, I have spent 25 minutes with the patient with telehealth technology discussing .     Medication Adjustments/Labs and Tests Ordered: Current medicines  are reviewed at length with the patient today.  Concerns regarding medicines are outlined above.     Medication Changes: No changes made   Disposition: Follow-up in 3 months   Patient/Family are satisfied with Plan of action and management. All questions answered  Corrin Parker, M.D.  [facility] Pulmonary & Critical Care Medicine  Medical Director [place] Director [place] Office [address] [place] [address], [place], [place] [address]

## 2018-10-20 NOTE — Progress Notes (Deleted)
VIDEO/TELEPHONE VISIT    In the setting of the current Covid19 crisis, you are scheduled for a (video) visit with your provider ***date/time Just as we do with many in-office visits, in order for you to participate in this visit, we must obtain consent.   I can obtain your verbal consent now.  PATIENT AGREES AND CONFIRMS -YES This Visit has Audio and Visual Capabilities for optimal patient care experience   Evaluation Performed:  Follow-up visit  This visit type was conducted due to national recommendations for restrictions regarding the COVID-19 Pandemic (e.g. social distancing).  This format is felt to be most appropriate for this patient at this time.  All issues noted in this document were discussed and addressed.  No physical exam was performed (except for noted visual exam findings with Telehealth visits).  See MyChart message from today for the patient's consent to telehealth for [place]     Date:  10/20/2018   ID:  Pamela Young, DOB Jul 27, 1950, MRN [id number]  Patient Location:  [place] [address]   Provider location:   [place]  PCP:  Sallee Lange, NP   Chief Complaint:      History of Present Illness:    Pamela Young is a 68 y.o. female who presents via audio/video conferencing for a telehealth visit today.   The patient does not symptoms concerning for COVID-19 infection (fever, chills, cough, or new SHORTNESS OF BREATH).   Progressive SOB and increased WOB No fevers Previous h/o hydropneumotorax H/o left sided pleural effusion    Prior RADIOLOGY STUDIES  The following studies were reviewed today: 3.26 RT sided effusion    No outpatient medications have been marked as taking for the 10/20/18 encounter (Telemedicine) with Flora Lipps, MD.     Allergies:   Ibuprofen and Codeine   Social History   Tobacco Use  . Smoking status: Former Smoker    Packs/day: 1.00    Types:  Cigarettes    Last attempt to quit: 2008    Years since quitting: 12.2  . Smokeless tobacco: Never Used  Substance Use Topics  . Alcohol use: No    Frequency: Never  . Drug use: No     Review of Systems:  Gen:  Denies  fever, sweats, chills weigh loss  HEENT: Denies blurred vision, double vision, ear pain, eye pain, hearing loss, nose bleeds, sore throat Cardiac:  No dizziness, chest pain or heaviness, chest tightness,edema, No JVD Resp:   No cough, -sputum production, +shortness of breath,-wheezing, -hemoptysis,  Gi: Denies swallowing difficulty, stomach pain, nausea or vomiting, diarrhea, constipation, bowel incontinence Gu:  Denies bladder incontinence, burning urine Ext:   Denies Joint pain, stiffness or swelling Skin: Denies  skin rash, easy bruising or bleeding or hives Endoc:  Denies polyuria, polydipsia , polyphagia or weight change Psych:   Denies depression, insomnia or hallucinations  Other:  All other systems negative       Labs/Other Tests and Data Reviewed:    Recent Labs: 08/10/2018: B Natriuretic Peptide 32.0; TSH 3.124 09/23/2018: ALT 14 09/29/2018: BUN 28; Creatinine, Ser 1.16; Hemoglobin 10.2; Platelets 227; Potassium 3.6; Sodium 134      ASSESSMENT & PLAN:    Patients needs immediate assement for progressive SOB and increase WOB likely recurrent pleural effusion Will need assessment for hypoxia and CT chest for further assessment Patient advised to go to ER    COVID-19 Education: The signs and symptoms of  COVID-19 were discussed with the patient and how to seek care for testing (follow up with PCP or arrange E-visit).  The importance of social distancing was discussed today.  Patient Risk:   After full review of this patients clinical status, I feel that they are at least moderate risk at this time.  Time:   Today, I have spent 25 minutes with the patient with telehealth technology discussing .     Medication Adjustments/Labs and Tests  Ordered: Current medicines are reviewed at length with the patient today.  Concerns regarding medicines are outlined above.     Medication Changes: No changes made   Disposition: Follow-up in 3 months   Patient/Family are satisfied with Plan of action and management. All questions answered  Corrin Parker, M.D.  [facility] Pulmonary & Critical Care Medicine  Medical Director [place] Director [place] Office [address] [place] [address], [place], [place] [address]

## 2018-10-20 NOTE — ED Notes (Addendum)
PT taken to [place]. Will be taken to assigned hospital bed after procedure.

## 2018-10-20 NOTE — ED Triage Notes (Signed)
Pt dropped off at ER by husband. Pt states that she has been SOB X 2 weeks, worsening over the last week. States that when she ambulates she becomes tired and feels exhausted. Called her PCP today and advised to come to ER. Hx of lung operations within last month per report. Pt wearing mask. Pt reports mild productive cough, clear over last week. Denies fever. Denies CP. No oxygen use at home.

## 2018-10-20 NOTE — Telephone Encounter (Signed)
Spoke to Dr. Mortimer Fries, he is going to call patient.

## 2018-10-20 NOTE — Telephone Encounter (Signed)
Patients son Kasandra Knudsen called and wanted to talk to you personally about his mother. She is currently in the hospital and due to the COVID-19 restrictions he is unable to go visit her. Please call him directly at [phone]

## 2018-10-20 NOTE — ED Provider Notes (Signed)
[facility] Emergency Department Provider Note  Time seen: 11:21 AM  I have reviewed the triage vital signs and the nursing notes.   HISTORY  Chief Complaint Shortness of Breath   HPI Pamela Young is a 68 y.o. female with a past medical history of chronic kidney disease, gastric reflux, hypertension, hyperlipidemia, recurrent pleural effusions status post recent talc procedure, presents to the emergency department for shortness of breath.  According to the patient over the past few weeks the patient has progressive worsening shortness of breath.  Patient has history of recurrent pleural effusions, had a talc procedure done the beginning of this month.  Patient states last time she saw her doctor she began accumulating fluid on the right side of her chest now.  Has been having progressive worsening over the past 2 to [redacted] weeks along with cough which she states is chronic.  Denies any fever.  Denies any travel, no high risk contacts.   Past Medical History:  Diagnosis Date  . Anemia    vitamin d3 deficiency  . Arthritis    rheumatoid arthritis  . Borderline abnormal TFTs 02/10/2018   Overview:  Borderline TSH/FT4, that have improved, but has high TPO antibodies.  . Chronic bilateral pleural effusions 08/2018  . Chronic gouty arthritis 12/22/2017  . CKD (chronic kidney disease) stage 3, GFR 30-59 ml/min (HCC) 01/27/2017   Overview:  Overview:  Being followed by Dr. Holley Raring Overview:  Being followed by Dr. Holley Raring  . Coronary artery disease involving native coronary artery of native heart 02/17/2017   Overview:  By ct scan   . Dyspnea    prior to removal of fluids  . Elevated erythrocyte sedimentation rate 12/10/2017  . GERD (gastroesophageal reflux disease)   . Hyperlipemia   . Hyperlipidemia 05/31/2014  . Hypertension   . Hyperuricemia 12/10/2017  . 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 Renal disorder   . Secondary  hyperparathyroidism ([place]) 07/21/2015   Overview:  Overview:  Followed by Dr. Holley Raring Overview:  Followed by Dr. Holley Raring  . Vitamin D deficiency 07/21/2015   Overview:  Overview:  Vit D 15 (checked thru nephrology, who is supplementing w/ ergocalciferol). Overview:  Vit D 15 (checked thru nephrology, who is supplementing w/ ergocalciferol).    Patient Active Problem List   Diagnosis Date Noted  . Encounter for long-term (current) use of high-risk medication 08/23/2018  . Pleural effusion 08/11/2018  . Respiratory distress 08/11/2018  . Seronegative rheumatoid arthritis ([place]) 03/07/2018  . Borderline abnormal TFTs 02/10/2018  . 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 Chronic gouty arthritis 12/22/2017  . Hyperuricemia 12/10/2017  . Elevated erythrocyte sedimentation rate 12/10/2017  . Coronary artery disease involving native coronary artery of native heart 02/17/2017  . Chest pain 01/28/2017  . Obesity, Class III, BMI 40-49.9 (morbid obesity) ([place]) 01/27/2017  . CKD (chronic kidney disease) stage 3, GFR 30-59 ml/min (HCC) 01/27/2017  . Vitamin D deficiency 07/21/2015  . Secondary hyperparathyroidism ([place]) 07/21/2015  . Hypertension 05/31/2014  . Hyperlipidemia 05/31/2014    Past Surgical History:  Procedure Laterality Date  . CHEST TUBE INSERTION Left 09/27/2018   Procedure: CHEST TUBE INSERTION;  Surgeon: Nestor Lewandowsky, MD;  Location: [facility] ORS;  Service: General;  Laterality: Left;  . DILATION AND CURETTAGE OF UTERUS  2000  . THORACENTESIS Left 08/2018  . THORACOTOMY Left 09/27/2018   Procedure: Bronchoscopy with pleural biopsy and talc pleurodesis;  Surgeon: Nestor Lewandowsky, MD;  Location: [facility] ORS;  Service: General;  Laterality: Left;  . TUBAL LIGATION      Prior to Admission medications   Medication Sig Start Date End Date Taking? Authorizing Provider  acetaminophen (TYLENOL) 500 MG tablet Take 500 mg by mouth every 6 (six) hours as needed.    [provider]  allopurinol  (ZYLOPRIM) 300 MG tablet Take 300 mg by mouth daily.  01/18/18   [provider]  amLODipine (NORVASC) 10 MG tablet Take 10 mg by mouth daily.    [provider]  atenolol (TENORMIN) 25 MG tablet Take 25 mg by mouth daily.    [provider]  atorvastatin (LIPITOR) 40 MG tablet Take 1 tablet by mouth daily. Takes in evening 11/17/17   [provider]  Cholecalciferol (VITAMIN D3) 25 MCG (1000 UT) CAPS Take 2,000 Int'l Units by mouth daily.    [provider]  famotidine (PEPCID) 20 MG tablet Take 1 tablet (20 mg total) by mouth daily. 09/04/18 09/04/19  Nance Pear, MD  hydrALAZINE (APRESOLINE) 100 MG tablet Take 1 tablet by mouth 2 (two) times daily.  11/17/17   [provider]  hydroxychloroquine (PLAQUENIL) 200 MG tablet Take by mouth daily. Takes in the evening for gout    [provider]  losartan (COZAAR) 100 MG tablet Take 100 mg by mouth daily. Takes in the morning    [provider]  traMADol (ULTRAM) 50 MG tablet Take 1-2 tablets (50-100 mg total) by mouth every 6 (six) hours. 09/30/18   Tylene Fantasia, PA-C  triamterene-hydrochlorothiazide (MAXZIDE) 75-50 MG tablet Take 1 tablet by mouth daily.    [provider]    Allergies  Allergen Reactions  . Ibuprofen Other (See Comments)    Can not take due to kidney disease  . Codeine     'makes me feel funny'    Family History  Problem Relation Age of Onset  . Heart attack Mother   . Heart attack Father   . Hypertension Sister   . Hypertension Brother     Social History Social History   Tobacco Use  . Smoking status: Former Smoker    Packs/day: 1.00    Types: Cigarettes    Last attempt to quit: 2008    Years since quitting: 12.2  . Smokeless tobacco: Never Used  Substance Use Topics  . Alcohol use: No    Frequency: Never  . Drug use: No    Review of Systems Constitutional: Negative for fever. ENT: Negative for recent  illness/congestion Cardiovascular: Negative for chest pain. Respiratory: Worsening shortness of breath especially with exertion. Gastrointestinal: Negative for abdominal pain Musculoskeletal: Negative for musculoskeletal complaints Skin: Negative for skin complaints  Neurological: Negative for headache All other ROS negative  ____________________________________________   PHYSICAL EXAM:  VITAL SIGNS: ED Triage Vitals  Enc Vitals Group     BP 10/20/18 1043 (!) 156/74     Pulse Rate 10/20/18 1043 (!) 105     Resp --      Temp 10/20/18 1043 98.7 F (37.1 C)     Temp Source 10/20/18 1043 Oral     SpO2 10/20/18 1043 96 %     Weight 10/20/18 1043 210 lb (95.3 kg)     Height 10/20/18 1043 5\' 6"  (1.676 m)     Head Circumference --      Peak Flow --      Pain Score 10/20/18 1057 0     Pain Loc --  Pain Edu? --      Excl. in [place]? --    Constitutional: Alert and oriented. Well appearing and in no distress. Eyes: Normal exam ENT   Head: Normocephalic and atraumatic   Mouth/Throat: Mucous membranes are moist. Cardiovascular: Normal rate, regular rhythm. Respiratory: Mild tachypnea.  No retractions.  Patient has equal breath sounds bilaterally however somewhat diminished in the right base.  No wheeze rales or rhonchi. Gastrointestinal: Soft and nontender. No distention. Musculoskeletal: Nontender with normal range of motion in all extremities.  Neurologic:  Normal speech and language. No gross focal neurologic deficits  Skin:  Skin is warm, dry and intact.  Psychiatric: Mood and affect are normal.   ____________________________________________    EKG  EKG viewed and interpreted by myself shows what appears to be a sinus rhythm at 109 bpm with a narrow QRS, normal axis normal intervals besides prolonged QTC.  No ST changes.  ____________________________________________    RADIOLOGY  Large right pleural effusion. Compressive atelectasis in the right lower  lobe.  Loculated left hydropneumothorax. The pleural effusion component is small. The pneumothorax component is similar to prior chest x-ray.  Moderate pericardial effusion.  Platelike areas of airspace disease in the anterior right upper lobe, right middle lobe and lingula, most likely atelectasis.  ____________________________________________   INITIAL IMPRESSION / ASSESSMENT AND PLAN / ED COURSE  Pertinent labs & imaging results that were available during my care of the patient were reviewed by me and considered in my medical decision making (see chart for details).  Patient presents to the emergency department for progressive shortness of breath over the past 2 to 3 weeks.  Differential this time would include recurrent pleural effusion, pneumonia, pneumothorax, ACS or CHF.  We will check labs, EKG, proceed with CT imaging of the chest per Dr. Zoila Shutter note.  Patient agreeable to plan of care.  Currently patient satting 93 to 94% on room air.  Large pleural effusion on CT.  If this patient with Dr. Faith Rogue hospitalist as well as interventional.  Patient will be admitted to the hospital service for further treatment and work-up.  Cortnie Ringel was evaluated in Emergency Department on 10/20/2018 for the symptoms described in the history of present illness. She was evaluated in the context of the global COVID-19 pandemic, which necessitated consideration that the patient might be at risk for infection with the SARS-CoV-2 virus that causes COVID-19. Institutional protocols and algorithms that pertain to the evaluation of patients at risk for COVID-19 are in a state of rapid change based on information released by regulatory bodies including the CDC and federal and state organizations. These policies and algorithms were followed during the patient's care in the ED.  ____________________________________________   FINAL CLINICAL IMPRESSION(S) / ED DIAGNOSES  Dyspnea Pleural effusion    Harvest Dark, MD 10/20/18 1238

## 2018-10-20 NOTE — ED Notes (Addendum)
Daughter melody [phone] Alain Marion  [phone]  Husband [phone] --he has difficulty hearing.

## 2018-10-20 NOTE — Progress Notes (Signed)
Patient ID: Pamela Young, female   DOB: 28-Mar-1951, 68 y.o.   MRN: [id number]  ACP note  Patient present  Right pleural effusion with history of left pleural effusion, pericardial effusion, hypertension, chronic kidney disease stage III, seronegative rheumatoid arthritis and gout, obesity  CODE STATUS discussed and patient wishes to be a full code  Patient presenting with shortness of breath going on for weeks which has been worsening.  She has a history of left pleural effusion thought to be secondary to seronegative rheumatoid arthritis.  Now presents with right pleural effusion shortness of breath and cough which is worsening.  Patient hardly able to walk without shortness of breath and feels like she is almost going to pass out.  Patient will be admitted to the hospital for thoracentesis and further evaluation.  Time spent on ACP discussion 17 minutes Dr Loletha Grayer

## 2018-10-20 NOTE — Procedures (Signed)
US thoracentesis on right without difficulty.   Complications:  None  Blood Loss: none  See dictation in canopy pacs

## 2018-10-20 NOTE — ED Notes (Signed)
Date and time results received: 10/20/18  12:08 PM (use smartphrase ".now" to insert current time)  Test: troponin Critical Value: 0.03  Name of Provider Notified: Dr. Harvest Dark

## 2018-10-20 NOTE — ED Notes (Signed)
Pt back from CT. Pt states that her increased WOB from earlier is due to walking from wheelchair to bed and lying flat in CT scan. Pt encouraged to continue to deep breathe.

## 2018-10-20 NOTE — ED Notes (Addendum)
ED TO INPATIENT HANDOFF REPORT  ED Nurse Name and Phone #: [phone]  S Name/Age/Gender Pamela Young 68 y.o. female Room/Bed: ED31A/ED31A  Code Status   Code Status: Full Code  Home/SNF/Other Home Patient oriented to: self, place, time and situation Is this baseline? Yes   Triage Complete: Triage complete  Chief Complaint covid  Triage Note Pt dropped off at ER by husband. Pt states that she has been SOB X 2 weeks, worsening over the last week. States that when she ambulates she becomes tired and feels exhausted. Called her PCP today and advised to come to ER. Hx of lung operations within last month per report. Pt wearing mask. Pt reports mild productive cough, clear over last week. Denies fever. Denies CP. No oxygen use at home.   Allergies Allergies  Allergen Reactions  . Ibuprofen Other (See Comments)    Can not take due to kidney disease  . Codeine     'makes me feel funny'    Level of Care/Admitting Diagnosis ED Disposition    ED Disposition Condition [place]: [place] [100120]  Level of Care: Med-Surg [16]  Diagnosis: Pleural effusion on right [542706]  Admitting Physician: Loletha Grayer [[id number]]  Attending Physician: Loletha Grayer [[id number]]  Estimated length of stay: past midnight tomorrow  Certification:: I certify this patient will need inpatient services for at least 2 midnights  PT Class (Do Not Modify): Inpatient [101]  PT Acc Code (Do Not Modify): Private [1]       B Medical/Surgery History Past Medical History:  Diagnosis Date  . Anemia    vitamin d3 deficiency  . Arthritis    rheumatoid arthritis  . Borderline abnormal TFTs 02/10/2018   Overview:  Borderline TSH/FT4, that have improved, but has high TPO antibodies.  . Chronic bilateral pleural effusions 08/2018  . Chronic gouty arthritis 12/22/2017  . CKD (chronic kidney disease) stage 3, GFR 30-59 ml/min (HCC) 01/27/2017   Overview:   Overview:  Being followed by Dr. Holley Raring Overview:  Being followed by Dr. Holley Raring  . Coronary artery disease involving native coronary artery of native heart 02/17/2017   Overview:  By ct scan   . Dyspnea    prior to removal of fluids  . Elevated erythrocyte sedimentation rate 12/10/2017  . GERD (gastroesophageal reflux disease)   . Hyperlipemia   . Hyperlipidemia 05/31/2014  . Hypertension   . Hyperuricemia 12/10/2017  . 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 Renal disorder   . Secondary hyperparathyroidism ([place]) 07/21/2015   Overview:  Overview:  Followed by Dr. Holley Raring Overview:  Followed by Dr. Holley Raring  . Vitamin D deficiency 07/21/2015   Overview:  Overview:  Vit D 15 (checked thru nephrology, who is supplementing w/ ergocalciferol). Overview:  Vit D 15 (checked thru nephrology, who is supplementing w/ ergocalciferol).   Past Surgical History:  Procedure Laterality Date  . CHEST TUBE INSERTION Left 09/27/2018   Procedure: CHEST TUBE INSERTION;  Surgeon: Nestor Lewandowsky, MD;  Location: [facility] ORS;  Service: General;  Laterality: Left;  . DILATION AND CURETTAGE OF UTERUS  2000  . THORACENTESIS Left 08/2018  . THORACOTOMY Left 09/27/2018   Procedure: Bronchoscopy with pleural biopsy and talc pleurodesis;  Surgeon: Nestor Lewandowsky, MD;  Location: [facility] ORS;  Service: General;  Laterality: Left;  . TUBAL LIGATION       A IV Location/Drains/Wounds Patient Lines/Drains/Airways Status   Active  Line/Drains/Airways    Name:   Placement date:   Placement time:   Site:   Days:   Peripheral IV 10/20/18 Right Antecubital   10/20/18    1105    Antecubital   less than 1   Incision (Closed) 09/27/18 Flank Left   09/27/18    1522     23   Incision (Closed) 09/27/18 Flank Left   09/27/18    1522     23          Intake/Output Last 24 hours No intake or output data in the 24 hours ending 10/20/18 1323  Labs/Imaging Results for orders placed or performed during  the hospital encounter of 10/20/18 (from the past 48 hour(s))  CBC     Status: Abnormal   Collection Time: 10/20/18 11:20 AM  Result Value Ref Range   WBC 14.3 (H) 4.0 - 10.5 K/uL   RBC 4.06 3.87 - 5.11 MIL/uL   Hemoglobin 11.4 (L) 12.0 - 15.0 g/dL   HCT 36.6 36.0 - 46.0 %   MCV 90.1 80.0 - 100.0 fL   MCH 28.1 26.0 - 34.0 pg   MCHC 31.1 30.0 - 36.0 g/dL   RDW 14.7 11.5 - 15.5 %   Platelets 496 (H) 150 - 400 K/uL   nRBC 0.0 0.0 - 0.2 %    Comment: Performed at [facility], [address] [place], [place] [address]  Comprehensive metabolic panel     Status: Abnormal   Collection Time: 10/20/18 11:20 AM  Result Value Ref Range   Sodium 132 (L) 135 - 145 mmol/L   Potassium 4.5 3.5 - 5.1 mmol/L   Chloride 100 98 - 111 mmol/L   CO2 20 (L) 22 - 32 mmol/L   Glucose, Bld 136 (H) 70 - 99 mg/dL   BUN 22 8 - 23 mg/dL   Creatinine, Ser 1.65 (H) 0.44 - 1.00 mg/dL   Calcium 9.4 8.9 - 10.3 mg/dL   Total Protein 7.7 6.5 - 8.1 g/dL   Albumin 3.1 (L) 3.5 - 5.0 g/dL   AST 21 15 - 41 U/L   ALT 14 0 - 44 U/L   Alkaline Phosphatase 131 (H) 38 - 126 U/L   Total Bilirubin 0.7 0.3 - 1.2 mg/dL   GFR calc non Af Amer 32 (L) >60 mL/min   GFR calc Af Amer 37 (L) >60 mL/min   Anion gap 12 5 - 15    Comment: Performed at [facility], [address] [place], [place] [address]  Troponin I - ONCE - STAT     Status: Abnormal   Collection Time: 10/20/18 11:20 AM  Result Value Ref Range   Troponin I 0.03 (HH) <0.03 ng/mL    Comment: CRITICAL RESULT CALLED TO, READ BACK BY AND VERIFIED WITH ALLY Hagop Mccollam @1205  10/20/2018 MU/HKP Performed at [facility], [address] [place], [place] [address]   Brain natriuretic peptide     Status: None   Collection Time: 10/20/18 11:20 AM  Result Value Ref Range   B Natriuretic Peptide 94.0 0.0 - 100.0 pg/mL    Comment: Performed at [facility], [address] [place], [place] [address]   Ct Chest Wo Contrast  Result Date:  10/20/2018 CLINICAL DATA:  Chest pain, shortness of breath for 2 weeks. EXAM: CT CHEST WITHOUT CONTRAST TECHNIQUE: Multidetector CT imaging of the chest was performed following the standard protocol without IV contrast. COMPARISON:  Multiple chest x-rays, the most recent 10/13/2018. FINDINGS:  Cardiovascular: Heart is upper limits normal in size. Moderate pericardial effusion. Three-vessel coronary artery disease. Scattered aortic calcifications. No aneurysm. Mediastinum/Nodes: Scattered small mediastinal lymph nodes. No mediastinal, hilar, or axillary adenopathy. Lungs/Pleura: Large right pleural effusion and small left pleural effusion. Loculated areas of gas in the left pleural space compatible with hydropneumothorax. This is similar to prior plain film. Platelike airspace disease in the anterior right upper lobe, lingula, right middle lobe most compatible with areas of atelectasis. Compressive atelectasis in the right lower lobe. Upper imaging into the upper abdomen shows no acute findings. Musculoskeletal: Chest wall soft tissues are unremarkable. No acute bony abnormality. IMPRESSION: Large right pleural effusion. Compressive atelectasis in the right lower lobe. Loculated left hydropneumothorax. The pleural effusion component is small. The pneumothorax component is similar to prior chest x-ray. Moderate pericardial effusion. Platelike areas of airspace disease in the anterior right upper lobe, right middle lobe and lingula, most likely atelectasis. Electronically Signed   By: Rolm Baptise M.D.   On: 10/20/2018 12:05    Pending Labs Unresulted Labs (From admission, onward)    Start     Ordered   10/27/18 0500  Creatinine, serum  (enoxaparin (LOVENOX)    CrCl >/= 30 ml/min)  Weekly,   STAT    Comments:  while on enoxaparin therapy    10/20/18 1320   10/21/18 9983  Basic metabolic panel  Tomorrow morning,   STAT     10/20/18 1320   10/21/18 0500  CBC  Tomorrow morning,   STAT     10/20/18 1320    10/20/18 1323  Rheumatoid factor  Add-on,   AD     10/20/18 1322   10/20/18 1323  Rheumatoid factors, fluid  Once,   STAT     10/20/18 1322   10/20/18 1321  HIV antibody (Routine Testing)  Add-on,   AD     10/20/18 1320          Vitals/Pain Today's Vitals   10/20/18 1148 10/20/18 1246 10/20/18 1259 10/20/18 1322  BP:   (!) 151/76 (!) 143/78  Pulse: (!) 108 (!) 106 (!) 106 (!) 103  Resp: (!) 30 (!) 35 (!) 22 (!) 24  Temp:   98.2 F (36.8 C)   TempSrc:   Oral   SpO2: 94% 94% 94% 94%  Weight:      Height:      PainSc:  0-No pain 0-No pain 0-No pain    Isolation Precautions No active isolations  Medications Medications  enoxaparin (LOVENOX) injection 40 mg (has no administration in time range)  acetaminophen (TYLENOL) tablet 650 mg (has no administration in time range)    Or  acetaminophen (TYLENOL) suppository 650 mg (has no administration in time range)  ondansetron (ZOFRAN) tablet 4 mg (has no administration in time range)    Or  ondansetron (ZOFRAN) injection 4 mg (has no administration in time range)    Mobility walks Low fall risk   Focused Assessments Cardiac Assessment Handoff:  Cardiac Rhythm: Sinus tachycardia Lab Results  Component Value Date   TROPONINI 0.03 (HH) 10/20/2018   No results found for: DDIMER Does the Patient currently have chest pain? No     R Recommendations: See Admitting Provider Note  Report given to:   Additional Notes: scheduled for thoracentesis  Pt becomes very SOB when standing and walking short distances, bedside commode works better than ambulating to restroom. No oxygen at home.

## 2018-10-20 NOTE — Telephone Encounter (Signed)
Spoke to patient after she spoke to Dr. Mortimer Fries.  Advised to go to ED so that she can get chest X-ray and thoracentesis if needed.

## 2018-10-20 NOTE — ED Notes (Signed)
Patient transported to CT. Will complete EKG upon return

## 2018-10-20 NOTE — Telephone Encounter (Signed)
Pt called back in reference to this. Stated she has fluid on her lungs and is feeling worse and can not breathe.

## 2018-10-20 NOTE — ED Notes (Signed)
Kathlee Nations gave report to Caryl Pina, RN on 1C

## 2018-10-20 NOTE — H&P (Signed)
[facility] [place] NAME: Pamela Young    MR#:  [id number]  DATE OF BIRTH:  10-19-1950  DATE OF ADMISSION:  10/20/2018  PRIMARY CARE PHYSICIAN: Sallee Lange, NP   REQUESTING/REFERRING PHYSICIAN: Dr Harvest Dark  CHIEF COMPLAINT:   Chief Complaint  Patient presents with  . Shortness of Breath    HISTORY OF PRESENT ILLNESS:  Pamela Young  is a 67 y.o. female with a known history of seronegative rheumatoid arthritis presents with shortness of breath.  She had a telemetry visit with Dr. Mortimer Fries pulmonology today and was sent to the ER for evaluation on whether she has a recurrent pleural effusion.  She states that she has been having shortness of breath going on for weeks.  She can hardly talk.  She is short of breath with minimal activity.  She even felt like she was going to pass out.  She had some chest pain on the right side.  She has not felt well for months.  She had previous procedure on the left lung for pleural effusion and now in the ER CT scan showing right lung pleural effusion.  PAST MEDICAL HISTORY:   Past Medical History:  Diagnosis Date  . Anemia    vitamin d3 deficiency  . Arthritis    rheumatoid arthritis  . Borderline abnormal TFTs 02/10/2018   Overview:  Borderline TSH/FT4, that have improved, but has high TPO antibodies.  . Chronic bilateral pleural effusions 08/2018  . Chronic gouty arthritis 12/22/2017  . CKD (chronic kidney disease) stage 3, GFR 30-59 ml/min (HCC) 01/27/2017   Overview:  Overview:  Being followed by Dr. Holley Raring Overview:  Being followed by Dr. Holley Raring  . Coronary artery disease involving native coronary artery of native heart 02/17/2017   Overview:  By ct scan   . Dyspnea    prior to removal of fluids  . Elevated erythrocyte sedimentation rate 12/10/2017  . GERD (gastroesophageal reflux disease)   . Hyperlipemia   . Hyperlipidemia 05/31/2014  . Hypertension   . Hyperuricemia  12/10/2017  . 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 Renal disorder   . Secondary hyperparathyroidism ([place]) 07/21/2015   Overview:  Overview:  Followed by Dr. Holley Raring Overview:  Followed by Dr. Holley Raring  . Vitamin D deficiency 07/21/2015   Overview:  Overview:  Vit D 15 (checked thru nephrology, who is supplementing w/ ergocalciferol). Overview:  Vit D 15 (checked thru nephrology, who is supplementing w/ ergocalciferol).    PAST SURGICAL HISTORY:   Past Surgical History:  Procedure Laterality Date  . CHEST TUBE INSERTION Left 09/27/2018   Procedure: CHEST TUBE INSERTION;  Surgeon: Nestor Lewandowsky, MD;  Location: [facility] ORS;  Service: General;  Laterality: Left;  . DILATION AND CURETTAGE OF UTERUS  2000  . THORACENTESIS Left 08/2018  . THORACOTOMY Left 09/27/2018   Procedure: Bronchoscopy with pleural biopsy and talc pleurodesis;  Surgeon: Nestor Lewandowsky, MD;  Location: [facility] ORS;  Service: General;  Laterality: Left;  . TUBAL LIGATION      SOCIAL HISTORY:   Social History   Tobacco Use  . Smoking status: Former Smoker    Packs/day: 1.00    Types: Cigarettes    Last attempt to quit: 2008    Years since quitting: 12.2  . Smokeless tobacco: Never Used  Substance Use Topics  . Alcohol use: No    Frequency: Never    FAMILY HISTORY:  Family History  Problem Relation Age of Onset  . Heart attack Mother   . Heart attack Father   . Hypertension Sister   . Hypertension Brother     DRUG ALLERGIES:   Allergies  Allergen Reactions  . Ibuprofen Other (See Comments)    Can not take due to kidney disease  . Codeine     'makes me feel funny'    REVIEW OF SYSTEMS:  CONSTITUTIONAL: No fever, chills or sweats.  Positive for weight loss.  Positive for fatigue.   EYES: No blurred or double vision.  EARS, NOSE, AND THROAT: No tinnitus or ear pain. No sore throat.  Positive for postnasal drip RESPIRATORY: Positive for cough with clear phlegm.   Positive for shortness of breath.  No wheezing or hemoptysis.  CARDIOVASCULAR: Some chest pain, no orthopnea, edema.  GASTROINTESTINAL: No nausea, vomiting, or abdominal pain. No blood in bowel movements.  Occasional diarrhea GENITOURINARY: No dysuria, hematuria.  ENDOCRINE: No polyuria, nocturia,  HEMATOLOGY: No anemia, easy bruising or bleeding SKIN: No rash or lesion. MUSCULOSKELETAL: No joint pain or arthritis.   NEUROLOGIC: Felt like she is going to pass out PSYCHIATRY: No anxiety or depression.   MEDICATIONS AT HOME:   Prior to Admission medications   Medication Sig Start Date End Date Taking? Authorizing Provider  acetaminophen (TYLENOL) 500 MG tablet Take 500 mg by mouth every 6 (six) hours as needed.    [provider]  allopurinol (ZYLOPRIM) 300 MG tablet Take 300 mg by mouth daily.  01/18/18   [provider]  amLODipine (NORVASC) 10 MG tablet Take 10 mg by mouth daily.    [provider]  atenolol (TENORMIN) 25 MG tablet Take 25 mg by mouth daily.    [provider]  atorvastatin (LIPITOR) 40 MG tablet Take 1 tablet by mouth daily. Takes in evening 11/17/17   [provider]  Cholecalciferol (VITAMIN D3) 25 MCG (1000 UT) CAPS Take 2,000 Int'l Units by mouth daily.    [provider]  famotidine (PEPCID) 20 MG tablet Take 1 tablet (20 mg total) by mouth daily. 09/04/18 09/04/19  Nance Pear, MD  hydrALAZINE (APRESOLINE) 100 MG tablet Take 1 tablet by mouth 2 (two) times daily.  11/17/17   [provider]  hydroxychloroquine (PLAQUENIL) 200 MG tablet Take by mouth daily. Takes in the evening for gout    [provider]  losartan (COZAAR) 100 MG tablet Take 100 mg by mouth daily. Takes in the morning    [provider]  traMADol (ULTRAM) 50 MG tablet Take 1-2 tablets (50-100 mg total) by mouth every 6 (six) hours. 09/30/18   Tylene Fantasia, PA-C  triamterene-hydrochlorothiazide (MAXZIDE) 75-50 MG tablet  Take 1 tablet by mouth daily.    [provider]      VITAL SIGNS:  Blood pressure (!) 143/78, pulse (!) 103, temperature 98.2 F (36.8 C), temperature source Oral, resp. rate (!) 24, height 5\' 6"  (1.676 m), weight 95.3 kg, SpO2 94 %.  PHYSICAL EXAMINATION:  GENERAL:  68 y.o.-year-old patient lying in the bed with no acute distress.  EYES: Pupils equal, round, reactive to light and accommodation. No scleral icterus. Extraocular muscles intact.  HEENT: Head atraumatic, normocephalic. Oropharynx and nasopharynx clear.  NECK:  Supple, no jugular venous distention. No thyroid enlargement, no tenderness.  LUNGS: Normal breath sounds bilaterally, no wheezing, rales,rhonchi or crepitation. No use of accessory muscles of respiration.  CARDIOVASCULAR: S1, S2 normal. No murmurs, rubs, or gallops.  ABDOMEN:  Soft, nontender, nondistended. Bowel sounds present. No organomegaly or mass.  EXTREMITIES: No pedal edema, cyanosis, or clubbing.  NEUROLOGIC: Cranial nerves II through XII are intact. Muscle strength 5/5 in all extremities. Sensation intact. Gait not checked.  PSYCHIATRIC: The patient is alert and oriented x 3.  SKIN: No rash, lesion, or ulcer.   LABORATORY PANEL:   CBC Recent Labs  Lab 10/20/18 1120  WBC 14.3*  HGB 11.4*  HCT 36.6  PLT 496*   ------------------------------------------------------------------------------------------------------------------  Chemistries  Recent Labs  Lab 10/20/18 1120  NA 132*  K 4.5  CL 100  CO2 20*  GLUCOSE 136*  BUN 22  CREATININE 1.65*  CALCIUM 9.4  AST 21  ALT 14  ALKPHOS 131*  BILITOT 0.7   ------------------------------------------------------------------------------------------------------------------  Cardiac Enzymes Recent Labs  Lab 10/20/18 1120  TROPONINI 0.03*   ------------------------------------------------------------------------------------------------------------------  RADIOLOGY:  Ct Chest Wo  Contrast  Result Date: 10/20/2018 CLINICAL DATA:  Chest pain, shortness of breath for 2 weeks. EXAM: CT CHEST WITHOUT CONTRAST TECHNIQUE: Multidetector CT imaging of the chest was performed following the standard protocol without IV contrast. COMPARISON:  Multiple chest x-rays, the most recent 10/13/2018. FINDINGS: Cardiovascular: Heart is upper limits normal in size. Moderate pericardial effusion. Three-vessel coronary artery disease. Scattered aortic calcifications. No aneurysm. Mediastinum/Nodes: Scattered small mediastinal lymph nodes. No mediastinal, hilar, or axillary adenopathy. Lungs/Pleura: Large right pleural effusion and small left pleural effusion. Loculated areas of gas in the left pleural space compatible with hydropneumothorax. This is similar to prior plain film. Platelike airspace disease in the anterior right upper lobe, lingula, right middle lobe most compatible with areas of atelectasis. Compressive atelectasis in the right lower lobe. Upper imaging into the upper abdomen shows no acute findings. Musculoskeletal: Chest wall soft tissues are unremarkable. No acute bony abnormality. IMPRESSION: Large right pleural effusion. Compressive atelectasis in the right lower lobe. Loculated left hydropneumothorax. The pleural effusion component is small. The pneumothorax component is similar to prior chest x-ray. Moderate pericardial effusion. Platelike areas of airspace disease in the anterior right upper lobe, right middle lobe and lingula, most likely atelectasis. Electronically Signed   By: Rolm Baptise M.D.   On: 10/20/2018 12:05    EKG:   Sinus tachycardia 109 bpm low voltage, flattening of T waves laterally  IMPRESSION AND PLAN:   1.  Right pleural effusion with history of left pleural effusion. Obtain a thoracentesis.  Send off laboratory data.  Consult Dr. Genevive Bi cardiothoracic surgery.  Last time thought to be secondary to seronegative rheumatoid arthritis.  Repeat echocardiogram since CT  scan shows pericardial effusion. 2.  Hypertension.  Patient states that she does not take all of the medications that she is prescribed and does not recall which one she does not take. 3.  Chronic kidney disease stage III 4.  Seronegative rheumatoid arthritis and gout.  On Plaquenil 5.  Obesity with a BMI of 33.8    All the records are reviewed and case discussed with ED provider. Management plans discussed with the patient, and she is in agreement.  CODE STATUS: Full code  TOTAL TIME TAKING CARE OF THIS PATIENT: 50 minutes, including ACP time.    Loletha Grayer M.D on 10/20/2018 at 1:34 PM  Between 7am to 6pm - Pager - [phone]  After 6pm call admission pager [phone]  [facility] Office  [phone]  CC: Primary care physician; Sallee Lange, NP

## 2018-10-20 NOTE — Progress Notes (Signed)
c 

## 2018-10-20 NOTE — Patient Instructions (Signed)
GO TO ER FOR FURTHER ASSESSMENT WILL NEED CT CHEST FOR FURTHER EVALUATION

## 2018-10-20 NOTE — Progress Notes (Signed)
Patient ID: Pamela Young, female   DOB: [birth year], 68 y.o.   MRN: [id number]  Spoke with patient.  She feels a little bit better after 1.7 L was taken off from underneath her right lung.  She feels a little bit sore.  Since her white blood cell count and the pleural fluid was elevated I will start antibiotics.  And wait for cultures.  Got permission to speak with her son Pamela Young on the phone. Pamela Young was very appreciated of of what we are doing for her.  She still needs further work-up on why this keeps on happening for her.  Could be related to the seronegative rheumatoid arthritis.  Await cardiothoracic surgery consultation.  Dr Loletha Grayer

## 2018-10-21 ENCOUNTER — Telehealth: Payer: Self-pay

## 2018-10-21 ENCOUNTER — Telehealth: Payer: Self-pay | Admitting: *Deleted

## 2018-10-21 ENCOUNTER — Inpatient Hospital Stay
Admit: 2018-10-21 | Discharge: 2018-10-21 | Disposition: A | Discharge status: Home / Self Care | Payer: PPO | Attending: Internal Medicine | Admitting: Internal Medicine

## 2018-10-21 LAB — ECHOCARDIOGRAM COMPLETE
Height: 66 in
Weight: 3360 oz

## 2018-10-21 LAB — CBC
HCT: 29.3 % — ABNORMAL LOW (ref 36.0–46.0)
Hemoglobin: 9.4 g/dL — ABNORMAL LOW (ref 12.0–15.0)
MCH: 28 pg (ref 26.0–34.0)
MCHC: 32.1 g/dL (ref 30.0–36.0)
MCV: 87.2 fL (ref 80.0–100.0)
Platelets: 344 10*3/uL (ref 150–400)
RBC: 3.36 MIL/uL — ABNORMAL LOW (ref 3.87–5.11)
RDW: 14.7 % (ref 11.5–15.5)
WBC: 12 10*3/uL — ABNORMAL HIGH (ref 4.0–10.5)
nRBC: 0 % (ref 0.0–0.2)

## 2018-10-21 LAB — BASIC METABOLIC PANEL
Anion gap: 9 (ref 5–15)
BUN: 31 mg/dL — ABNORMAL HIGH (ref 8–23)
CO2: 19 mmol/L — ABNORMAL LOW (ref 22–32)
Calcium: 8.3 mg/dL — ABNORMAL LOW (ref 8.9–10.3)
Chloride: 102 mmol/L (ref 98–111)
Creatinine, Ser: 1.79 mg/dL — ABNORMAL HIGH (ref 0.44–1.00)
GFR calc Af Amer: 33 mL/min — ABNORMAL LOW (ref >60)
GFR calc non Af Amer: 29 mL/min — ABNORMAL LOW (ref >60)
Glucose, Bld: 98 mg/dL (ref 70–99)
Potassium: 4.3 mmol/L (ref 3.5–5.1)
Sodium: 130 mmol/L — ABNORMAL LOW (ref 135–145)

## 2018-10-21 LAB — PH, BODY FLUID: pH, Body Fluid: 7.4

## 2018-10-21 NOTE — Progress Notes (Signed)
Dressing intact and left undisturbed.

## 2018-10-21 NOTE — Telephone Encounter (Signed)
-----   Message from Laverle Hobby, MD sent at 10/17/2018  9:40 AM EDT ----- We can do a follow up virtual visit after her next visit with Dr. Genevive Bi.  ----- Message ----- From: Darreld Mclean, RN Sent: 10/14/2018  10:53 AM EDT To: Laverle Hobby, MD  Dr. Genevive Bi office called to schedule f/u apt. Please advise on last note when you would like to see her back and if you would like phone or office visit.  Thanks! Erasmo Downer

## 2018-10-21 NOTE — Progress Notes (Signed)
*  PRELIMINARY RESULTS* Echocardiogram 2D Echocardiogram has been performed.  Sherrie Sport 10/21/2018, 8:29 AM

## 2018-10-21 NOTE — Progress Notes (Signed)
Discharge Note Pt left the patient care area at 1131 escorted by Nurse Rosetta Posner.  Pt had all her belongings including cash and credit cards. Discharge education provided to patient and pt verbalized understanding. Pt was informed that she would not need to continue on ABT therapy per Dr Benjie Karvonen. Pt's spouse was providing transportation to their home.

## 2018-10-21 NOTE — Telephone Encounter (Signed)
Patient called the office today wanting to know if Dr. Genevive Bi could prescribe her Lasix.   I did check with Caryl-Lyn, LPN who states the patient would need to address this with her PCP.   Patient notified and verbalizes understanding.

## 2018-10-21 NOTE — Discharge Summary (Signed)
[facility] [place] NAME: Pamela Young    MR#:  [id number]  DATE OF BIRTH:  06-06-1951  DATE OF ADMISSION:  10/20/2018 ADMITTING PHYSICIAN: Loletha Grayer, MD  DATE OF DISCHARGE: 10/21/2018  PRIMARY CARE PHYSICIAN: Sallee Lange, NP    ADMISSION DIAGNOSIS:  Dyspnea [R06.00] SOB (shortness of breath) [R06.02] Pleural effusion [J90] Status post thoracentesis [Z98.890]  DISCHARGE DIAGNOSIS:  Active Problems:   Pleural effusion on right   SECONDARY DIAGNOSIS:   Past Medical History:  Diagnosis Date  . Anemia    vitamin d3 deficiency  . Arthritis    rheumatoid arthritis  . Borderline abnormal TFTs 02/10/2018   Overview:  Borderline TSH/FT4, that have improved, but has high TPO antibodies.  . Chronic bilateral pleural effusions 08/2018  . Chronic gouty arthritis 12/22/2017  . CKD (chronic kidney disease) stage 3, GFR 30-59 ml/min (HCC) 01/27/2017   Overview:  Overview:  Being followed by Dr. Holley Raring Overview:  Being followed by Dr. Holley Raring  . Coronary artery disease involving native coronary artery of native heart 02/17/2017   Overview:  By ct scan   . Dyspnea    prior to removal of fluids  . Elevated erythrocyte sedimentation rate 12/10/2017  . GERD (gastroesophageal reflux disease)   . Hyperlipemia   . Hyperlipidemia 05/31/2014  . Hypertension   . Hyperuricemia 12/10/2017  . 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 Renal disorder   . Secondary hyperparathyroidism ([place]) 07/21/2015   Overview:  Overview:  Followed by Dr. Holley Raring Overview:  Followed by Dr. Holley Raring  . Vitamin D deficiency 07/21/2015   Overview:  Overview:  Vit D 15 (checked thru nephrology, who is supplementing w/ ergocalciferol). Overview:  Vit D 15 (checked thru nephrology, who is supplementing w/ ergocalciferol).    HOSPITAL COURSE:   68 year old female with seronegative rheumatoid arthritis and recurrent left pleural  effusion status post talc pleurodesis who presents with shortness of breath.  Patient was evaluated via telemetry medicine by her pulmonologist and was referred to the ER due to shortness of breath.  1.  Right pleural effusion status post thoracentesis with 1.7 L removed. Pleural effusion appears to be transudative in nature.  Patient does not need antibiotics at this point. Case discussed with Dr. Genevive Bi. Patient will have outpatient follow-up in 2 weeks for repeat chest x-ray.  Due to COVID-19 all elective procedures have been canceled.  If patient has recurrent pleural effusion recommendation for ongoing thoracentesis until COVID-19 is no longer an issue.  2.  Seronegative arthritis: Patient will continue on Plaquenil  3.  Hyponatremia from thoracentesis: Patient will need a repeat BMP 4.  Chronic kidney disease stage III: Creatinine is near baseline  5.  Essential hypertension: Continue Norvasc and losartan  6.  Hyperlipidemia: Continue statin DISCHARGE CONDITIONS AND DIET:   Stable Regular diet  CONSULTS OBTAINED:    DRUG ALLERGIES:   Allergies  Allergen Reactions  . Ibuprofen Other (See Comments)    Can not take due to kidney disease  . Codeine     'makes me feel funny'    DISCHARGE MEDICATIONS:   Allergies as of 10/21/2018      Reactions   Ibuprofen Other (See Comments)   Can not take due to kidney disease   Codeine    'makes me feel funny'      Medication List    TAKE these medications   acetaminophen 500  MG tablet Commonly known as:  TYLENOL Take 500 mg by mouth every 6 (six) hours as needed.   allopurinol 300 MG tablet Commonly known as:  ZYLOPRIM Take 300 mg by mouth daily.   amLODipine 10 MG tablet Commonly known as:  NORVASC Take 10 mg by mouth daily.   atenolol 25 MG tablet Commonly known as:  TENORMIN Take 25 mg by mouth daily.   atorvastatin 40 MG tablet Commonly known as:  LIPITOR Take 1 tablet by mouth daily. Takes in evening    famotidine 20 MG tablet Commonly known as:  PEPCID Take 1 tablet (20 mg total) by mouth daily.   hydrALAZINE 100 MG tablet Commonly known as:  APRESOLINE Take 1 tablet by mouth 2 (two) times daily.   hydroxychloroquine 200 MG tablet Commonly known as:  PLAQUENIL Take by mouth daily. Takes in the evening for gout   losartan 100 MG tablet Commonly known as:  COZAAR Take 100 mg by mouth daily. Takes in the morning   traMADol 50 MG tablet Commonly known as:  ULTRAM Take 1-2 tablets (50-100 mg total) by mouth every 6 (six) hours.   triamterene-hydrochlorothiazide 75-50 MG tablet Commonly known as:  MAXZIDE Take 1 tablet by mouth daily.   Vitamin D3 25 MCG (1000 UT) Caps Take 2,000 Int'l Units by mouth daily.         Today   CHIEF COMPLAINT:  Doing much better denies SOB/fevers/cough   VITAL SIGNS:  Blood pressure (!) 142/72, pulse 91, temperature 98.6 F (37 C), temperature source Oral, resp. rate 18, height 5\' 6"  (1.676 m), weight 95.3 kg, SpO2 94 %.   REVIEW OF SYSTEMS:  Review of Systems  Constitutional: Negative.  Negative for chills, fever and malaise/fatigue.  HENT: Negative.  Negative for ear discharge, ear pain, hearing loss, nosebleeds and sore throat.   Eyes: Negative.  Negative for blurred vision and pain.  Respiratory: Negative.  Negative for cough, hemoptysis, shortness of breath and wheezing.   Cardiovascular: Negative.  Negative for chest pain, palpitations and leg swelling.  Gastrointestinal: Negative.  Negative for abdominal pain, blood in stool, diarrhea, nausea and vomiting.  Genitourinary: Negative.  Negative for dysuria.  Musculoskeletal: Negative.  Negative for back pain.  Skin: Negative.   Neurological: Negative for dizziness, tremors, speech change, focal weakness, seizures and headaches.  Endo/Heme/Allergies: Negative.  Does not bruise/bleed easily.  Psychiatric/Behavioral: Negative.  Negative for depression, hallucinations and suicidal  ideas.     PHYSICAL EXAMINATION:   GENERAL:  68 y.o.-year-old patient lying in the bed with no acute distress.  NECK:  Supple, no jugular venous distention. No thyroid enlargement, no tenderness.  LUNGS: Normal breath sounds bilaterally, no wheezing, rales,rhonchi  No use of accessory muscles of respiration.  CARDIOVASCULAR: S1, S2 normal. No murmurs, rubs, or gallops.  ABDOMEN: Soft, non-tender, non-distended. Bowel sounds present. No organomegaly or mass.  EXTREMITIES: No pedal edema, cyanosis, or clubbing.  PSYCHIATRIC: The patient is alert and oriented x 3.  SKIN: No obvious rash, lesion, or ulcer.   DATA REVIEW:   CBC Recent Labs  Lab 10/21/18 0457  WBC 12.0*  HGB 9.4*  HCT 29.3*  PLT 344    Chemistries  Recent Labs  Lab 10/20/18 1120 10/21/18 0457  NA 132* 130*  K 4.5 4.3  CL 100 102  CO2 20* 19*  GLUCOSE 136* 98  BUN 22 31*  CREATININE 1.65* 1.79*  CALCIUM 9.4 8.3*  AST 21  --   ALT 14  --  ALKPHOS 131*  --   BILITOT 0.7  --     Cardiac Enzymes Recent Labs  Lab 10/20/18 1120  TROPONINI 0.03*    Microbiology Results  @[email or web address] @  RADIOLOGY:  Dg Chest 1 View  Result Date: 10/20/2018 CLINICAL DATA:  Status post right thoracentesis EXAM: CHEST  1 VIEW COMPARISON:  10/13/2018 FINDINGS: Cardiac shadow is enlarged but stable. The loculated hydropneumothorax on the left is again seen and slightly decreased in size when compared with the prior exam. Right-sided pleural effusion has been removed the a thoracentesis. No pneumothorax is seen. Mild interstitial changes are again noted as well as some right basilar atelectasis. IMPRESSION: No evidence of pneumothorax following right thoracentesis. Electronically Signed   By: Inez Catalina M.D.   On: 10/20/2018 15:39   Ct Chest Wo Contrast  Result Date: 10/20/2018 CLINICAL DATA:  Chest pain, shortness of breath for 2 weeks. EXAM: CT CHEST WITHOUT CONTRAST TECHNIQUE: Multidetector CT imaging of the chest was  performed following the standard protocol without IV contrast. COMPARISON:  Multiple chest x-rays, the most recent 10/13/2018. FINDINGS: Cardiovascular: Heart is upper limits normal in size. Moderate pericardial effusion. Three-vessel coronary artery disease. Scattered aortic calcifications. No aneurysm. Mediastinum/Nodes: Scattered small mediastinal lymph nodes. No mediastinal, hilar, or axillary adenopathy. Lungs/Pleura: Large right pleural effusion and small left pleural effusion. Loculated areas of gas in the left pleural space compatible with hydropneumothorax. This is similar to prior plain film. Platelike airspace disease in the anterior right upper lobe, lingula, right middle lobe most compatible with areas of atelectasis. Compressive atelectasis in the right lower lobe. Upper imaging into the upper abdomen shows no acute findings. Musculoskeletal: Chest wall soft tissues are unremarkable. No acute bony abnormality. IMPRESSION: Large right pleural effusion. Compressive atelectasis in the right lower lobe. Loculated left hydropneumothorax. The pleural effusion component is small. The pneumothorax component is similar to prior chest x-ray. Moderate pericardial effusion. Platelike areas of airspace disease in the anterior right upper lobe, right middle lobe and lingula, most likely atelectasis. Electronically Signed   By: Rolm Baptise M.D.   On: 10/20/2018 12:05   US Thoracentesis Asp Pleural Space W/img Guide  Result Date: 10/20/2018 INDICATION: Large right-sided pleural effusion EXAM: ULTRASOUND GUIDED therapeutic THORACENTESIS MEDICATIONS: None. COMPLICATIONS: None immediate. PROCEDURE: An ultrasound guided thoracentesis was thoroughly discussed with the patient and questions answered. The benefits, risks, alternatives and complications were also discussed. The patient understands and wishes to proceed with the procedure. Written consent was obtained. Ultrasound was performed to localize and mark an  adequate pocket of fluid in the right chest. The area was then prepped and draped in the normal sterile fashion. 1% Lidocaine was used for local anesthesia. Under ultrasound guidance a 6 Fr Safe-T-Centesis catheter was introduced. Thoracentesis was performed. The catheter was removed and a dressing applied. FINDINGS: A total of approximately 1.7 L of amber colored fluid fluid was removed. Samples were sent to the laboratory as requested by the clinical team. IMPRESSION: Successful ultrasound guided therapeutic thoracentesis yielding 1.7 L of pleural fluid. Electronically Signed   By: Inez Catalina M.D.   On: 10/20/2018 15:43      Allergies as of 10/21/2018      Reactions   Ibuprofen Other (See Comments)   Can not take due to kidney disease   Codeine    'makes me feel funny'      Medication List    TAKE these medications   acetaminophen 500 MG tablet Commonly known as:  TYLENOL Take 500  mg by mouth every 6 (six) hours as needed.   allopurinol 300 MG tablet Commonly known as:  ZYLOPRIM Take 300 mg by mouth daily.   amLODipine 10 MG tablet Commonly known as:  NORVASC Take 10 mg by mouth daily.   atenolol 25 MG tablet Commonly known as:  TENORMIN Take 25 mg by mouth daily.   atorvastatin 40 MG tablet Commonly known as:  LIPITOR Take 1 tablet by mouth daily. Takes in evening   famotidine 20 MG tablet Commonly known as:  PEPCID Take 1 tablet (20 mg total) by mouth daily.   hydrALAZINE 100 MG tablet Commonly known as:  APRESOLINE Take 1 tablet by mouth 2 (two) times daily.   hydroxychloroquine 200 MG tablet Commonly known as:  PLAQUENIL Take by mouth daily. Takes in the evening for gout   losartan 100 MG tablet Commonly known as:  COZAAR Take 100 mg by mouth daily. Takes in the morning   traMADol 50 MG tablet Commonly known as:  ULTRAM Take 1-2 tablets (50-100 mg total) by mouth every 6 (six) hours.   triamterene-hydrochlorothiazide 75-50 MG tablet Commonly known as:   MAXZIDE Take 1 tablet by mouth daily.   Vitamin D3 25 MCG (1000 UT) Caps Take 2,000 Int'l Units by mouth daily.        Management plans discussed with the patient and she is in agreement. Stable for discharge home  Patient should follow up with dr Genevive Bi 2 weeks for repeat CXR  CODE STATUS:     Code Status Orders  (From admission, onward)         Start     Ordered   10/20/18 1321  Full code  Continuous     10/20/18 1320        Code Status History    Date Active Date Inactive Code Status Order ID Comments User Context   09/27/2018 1735 09/30/2018 1629 Full Code [id number]  Nestor Lewandowsky, MD Inpatient   08/11/2018 0355 08/12/2018 1952 Full Code [id number]  Harrie Foreman, MD Inpatient    Advance Directive Documentation     Most Recent Value  Type of Advance Directive  Living will  Pre-existing out of facility DNR order (yellow form or pink MOST form)  -  "MOST" Form in Place?  -      TOTAL TIME TAKING CARE OF THIS PATIENT: 39 minutes.    Note: This dictation was prepared with Dragon dictation along with smaller phrase technology. Any transcriptional errors that result from this process are unintentional.  Bettey Costa M.D on 10/21/2018 at 8:52 AM  Between 7am to 6pm - Pager - [phone] After 6pm go to [email or web address] - password [email or web address] [facility]  Office  [phone]  CC: Primary care physician; [place] Martes Victoriano Lain, NP

## 2018-10-22 LAB — RHEUMATOID FACTOR: Rhuematoid fact SerPl-aCnc: 15.6 IU/mL — ABNORMAL HIGH (ref 0.0–13.9)

## 2018-10-22 LAB — HIV ANTIBODY (ROUTINE TESTING W REFLEX): HIV Screen 4th Generation wRfx: NONREACTIVE

## 2018-10-23 LAB — BODY FLUID CULTURE: Culture: NO GROWTH

## 2018-10-24 ENCOUNTER — Other Ambulatory Visit: Payer: Self-pay | Admitting: Internal Medicine

## 2018-10-24 ENCOUNTER — Telehealth: Payer: Self-pay | Admitting: Internal Medicine

## 2018-10-24 LAB — COMP PANEL: LEUKEMIA/LYMPHOMA

## 2018-10-24 LAB — CYTOLOGY - NON PAP

## 2018-10-24 MED ORDER — FUROSEMIDE 20 MG PO TABS: 20.0000 mg | ORAL_TABLET | Freq: Every day | ORAL | 11 refills | Status: AC

## 2018-10-24 MED ORDER — POTASSIUM CHLORIDE CRYS ER 20 MEQ PO TBCR: 20.0000 meq | EXTENDED_RELEASE_TABLET | Freq: Every day | ORAL | 5 refills | Status: AC

## 2018-10-24 NOTE — Telephone Encounter (Signed)
Called and spoke to pt, is who requesting Rx for Lasix.  Pt was instructed by Dr. Genevive Bi office on 10/21/18, to contact PCP regarding lasix. I have suggested that pt contact PCP.  Pt states that she did not feel that PCP would prescribe lasix without an office visit.  Pt feels that she is developing fluid buildup in her lung and around her heart again.   DR please advise. thanks

## 2018-10-24 NOTE — Telephone Encounter (Signed)
Laverle Hobby, MD  [facility]        Sent in script for lasix and potassium.     Pt is aware that both lasix and potassium has been sent to preferred pharmacy. Nothing further is needed.

## 2018-10-27 DIAGNOSIS — I10 Essential (primary) hypertension: Secondary | ICD-10-CM | POA: Diagnosis not present

## 2018-10-27 DIAGNOSIS — N183 Chronic kidney disease, stage 3 (moderate): Secondary | ICD-10-CM | POA: Diagnosis not present

## 2018-10-27 DIAGNOSIS — J90 Pleural effusion, not elsewhere classified: Secondary | ICD-10-CM | POA: Diagnosis not present

## 2018-10-27 DIAGNOSIS — R399 Unspecified symptoms and signs involving the genitourinary system: Secondary | ICD-10-CM | POA: Diagnosis not present

## 2018-11-01 LAB — ACID FAST CULTURE WITH REFLEXED SENSITIVITIES (MYCOBACTERIA): Acid Fast Culture: NEGATIVE

## 2018-11-04 ENCOUNTER — Other Ambulatory Visit: Payer: Self-pay

## 2018-11-04 ENCOUNTER — Encounter: Payer: Self-pay | Admitting: Cardiothoracic Surgery

## 2018-11-04 ENCOUNTER — Ambulatory Visit
Admission: RE | Admit: 2018-11-04 | Discharge: 2018-11-04 | Disposition: A | Discharge status: Home / Self Care | Payer: PPO | Source: Ambulatory Visit | Attending: Cardiothoracic Surgery | Admitting: Cardiothoracic Surgery

## 2018-11-04 ENCOUNTER — Ambulatory Visit
Admission: RE | Admit: 2018-11-04 | Discharge: 2018-11-04 | Disposition: A | Discharge status: Home / Self Care | Payer: PPO | Attending: Cardiothoracic Surgery | Admitting: Cardiothoracic Surgery

## 2018-11-04 ENCOUNTER — Ambulatory Visit (INDEPENDENT_AMBULATORY_CARE_PROVIDER_SITE_OTHER): Payer: PPO | Admitting: Cardiothoracic Surgery

## 2018-11-04 VITALS — BP 151/81 | HR 94 | Temp 97.5°F | Resp 18 | Ht 67.0 in | Wt 209.0 lb

## 2018-11-04 DIAGNOSIS — J90 Pleural effusion, not elsewhere classified: Secondary | ICD-10-CM | POA: Insufficient documentation

## 2018-11-04 DIAGNOSIS — J9811 Atelectasis: Secondary | ICD-10-CM | POA: Diagnosis not present

## 2018-11-04 NOTE — Progress Notes (Signed)
  Patient ID: Pamela Young, female   DOB: [birth year], 68 y.o.   MRN: [id number]  HISTORY: She returns today in follow-up.  She did undergo a left thoracoscopy and talc pleurodesis for recurrent pleural effusion.  She did well after that but then presented with a right-sided pleural effusion.  This was drained approximately 2 weeks ago and was suggestive of an inflammatory exudative effusion.  There is no evidence of malignancy.  She states that she has continued to have some shortness of breath but it is improved.  She complains of pain on her left back and chest.  This is been an ongoing problem for her for the last couple of years.  She did see Dr. Gwynn Burly and had an echocardiogram done which was essentially unremarkable.  There is evidence of left ventricular hypertrophy but no evidence of any wall motion abnormalities.  She would like to have the right-sided pleural effusion drained and undergo talc pleurodesis again.   Vitals:   11/04/18 0852  BP: (!) 151/81  Pulse: 94  Resp: 18  Temp: (!) 97.5 F (36.4 C)  SpO2: 95%     EXAM:    Resp: Lungs are distant and equal bilaterally.  No respiratory distress, normal effort. Heart:  Regular without murmurs Abd:  Abdomen is soft, non distended and non tender. No masses are palpable.  There is no rebound and no guarding.  Neurological: Alert and oriented to person, place, and time. Coordination normal.  Skin: Skin is warm and dry. No rash noted. No diaphoretic. No erythema. No pallor.  Psychiatric: Normal mood and affect. Normal behavior. Judgment and thought content normal.    ASSESSMENT: She did have a chest x-ray today which I have independently reviewed.  There is a trace right pleural effusion.  The left hemithorax is essentially clear.   PLAN:   I had a long discussion with her regarding the indications and risks for thoracoscopic talc insufflation.  She is scheduled to see Dr. brew Monday in 2 weeks and she is also scheduled to have  a phone interview with Dr. Gwynn Burly.  Once she is evaluated by them we can then make further evaluations and recommendations.  I did not make return to clinic visit for her at this time but would be happy to see her in the future if that is what is required.    Nestor Lewandowsky, MD

## 2018-11-04 NOTE — Patient Instructions (Addendum)
Call [facility] today and get the results of your Echo.   Please keep your appointment with Dr.Ramachondron.   Please call our office if you have questions or concerns.

## 2018-11-15 ENCOUNTER — Ambulatory Visit (INDEPENDENT_AMBULATORY_CARE_PROVIDER_SITE_OTHER): Payer: PPO | Admitting: Internal Medicine

## 2018-11-15 DIAGNOSIS — J90 Pleural effusion, not elsewhere classified: Secondary | ICD-10-CM

## 2018-11-15 NOTE — Patient Instructions (Signed)
We will repeat a chest xray in about 3 weeks and see you back after that.  Call us earlier if your breathing gets worse and we can do the chest xray earlier.

## 2018-11-15 NOTE — Progress Notes (Signed)
Pamela Young    Virtual Visit via Telephone Note I connected with patient on 11/15/18 at 10:00 AM EDT by telephone and verified that I am speaking with the correct person using two identifiers.   I discussed the limitations, risks, security and privacy concerns of performing an evaluation and management service by telephone and the availability of in person appointments. I also discussed with the patient that there may be a patient responsible charge related to this service. The patient expressed understanding and agreed to proceed. I discussed the assessment and treatment plan with the patient. The patient was provided an opportunity to ask questions and all were answered. The patient agreed with the plan and demonstrated an understanding of the instructions. Please see note below for further detail.    The patient was advised to call back or seek an in-person evaluation if the symptoms worsen or if the condition fails to improve as anticipated.  I provided 15 minutes of non-face-to-face time during this encounter.   Pamela Hobby, MD    Assessment and Plan:  Recurrent left pleural effusion, now s/p pleurodesis.  - No further intervention necessary at this time.  Right-sided pleural effusion. - Status post thoracentesis on 4/2.  Review of subsequent image shows continued resolution. - We will repeat chest x-ray in 3 weeks and have the patient follow-up with [place] after that.  She is asked to call us back sooner if she has dyspnea, and we will repeat a chest x-ray sooner.  If the fluid reaccumulates to a significant degree will likely need repeat thoracentesis.  Orders Placed This Encounter  Procedures  . DG Chest 2 View      Date: 11/15/2018  MRN# [id number] Pamela Young 1950-10-11  Referring Physician: Dr. Estanislado Pandy for pleural effusion.   Pamela Young is a 68 y.o. old female seen in consultation for chief complaint of:    No chief complaint on file.   HPI:   The patient is a 68 year old female with a recurrent left-sided pleural effusion status post multiple thoracenteses which were unrevealing.  She ultimately underwent a left-sided call pleurodesis on 09/27/2018.  He went back to the ED on 10/20/2018 with dyspnea.  She was found to have a large right-sided pleural effusion underwent ultrasound-guided thoracentesis on 4/2 with drainage of 1.7 L.  The fluid appeared to be exudative with elevated white count, cytology negative, flow cytometry negative, cultures negative.  Since then she has been feeling well, she denies dyspnea. No pain or pressure on the right side. It is feeling much better than when she had her right thoracentesis.   Right-sided thoracentesis 10/20/2018, removal of 1.7 L Left-sided thoracentesis on 08/30/2018, removal of 800 cc. left-sided thoracentesis on 08/11/2018, with removal of 1600 cc  **CT chest 10/20/2018>> images personally reviewed, there is a large right-sided pleural effusion subsequent with pericardial effusion, chest x-ray on 4/17 shows a small amount of reaccumulation.  **Echocardiogram 10/21/2018>> EF 08% normal RV systolic function. **Thoracentesis 08/11/2018>> fluid appears exudative.  Predominantly lymphocytic, cytology negative, culture negative. **Chest x-ray 1/22, CT chest 08/11/2018>> there is a large left-sided pleural effusion, there is a possible mass within atelectatic lung, however it is obscured. **Echocardiogram 01/28/2017>> Ultrasound enhancing agent utilized to improve endocardial border  definition  Left ventricular hypertrophy - mild  Normal left ventricular systolic function, ejection fraction 60 to 65%  Diastolic dysfunction - grade I (normal filling pressures)  Aortic sclerosis  Normal right ventricular systolic function  Mildly elevated right atrial pressure  Medication:  Current Outpatient Medications:  .  acetaminophen (TYLENOL) 500 MG tablet, Take 500 mg by mouth every 6 (six)  hours as needed., Disp: , Rfl:  .  allopurinol (ZYLOPRIM) 300 MG tablet, Take 300 mg by mouth daily. , Disp: , Rfl: 2 .  amLODipine (NORVASC) 10 MG tablet, Take 10 mg by mouth daily., Disp: , Rfl:  .  atenolol (TENORMIN) 25 MG tablet, Take 25 mg by mouth daily., Disp: , Rfl:  .  atorvastatin (LIPITOR) 40 MG tablet, Take 1 tablet by mouth daily. Takes in evening, Disp: , Rfl: 4 .  Cholecalciferol (VITAMIN D3) 25 MCG (1000 UT) CAPS, Take 2,000 Int'l Units by mouth daily., Disp: , Rfl:  .  famotidine (PEPCID) 20 MG tablet, Take 1 tablet (20 mg total) by mouth daily., Disp: 30 tablet, Rfl: 1 .  furosemide (LASIX) 20 MG tablet, Take 1 tablet (20 mg total) by mouth daily., Disp: 30 tablet, Rfl: 11 .  hydroxychloroquine (PLAQUENIL) 200 MG tablet, Take by mouth daily. Takes in the evening for gout, Disp: , Rfl:  .  losartan (COZAAR) 100 MG tablet, Take 100 mg by mouth daily. Takes in the morning, Disp: , Rfl:  .  potassium chloride SA (K-DUR,KLOR-CON) 20 MEQ tablet, Take 1 tablet (20 mEq total) by mouth daily., Disp: 30 tablet, Rfl: 5 .  triamterene-hydrochlorothiazide (MAXZIDE) 75-50 MG tablet, Take 1 tablet by mouth daily., Disp: , Rfl:    Allergies:  Ibuprofen and Codeine  Past Medical History:  Diagnosis Date  . Anemia    vitamin d3 deficiency  . Arthritis    rheumatoid arthritis  . Borderline abnormal TFTs 02/10/2018   Overview:  Borderline TSH/FT4, that have improved, but has high TPO antibodies.  . Chronic bilateral pleural effusions 08/2018  . Chronic gouty arthritis 12/22/2017  . CKD (chronic kidney disease) stage 3, GFR 30-59 ml/min (HCC) 01/27/2017   Overview:  Overview:  Being followed by Dr. Holley Raring Overview:  Being followed by Dr. Holley Raring  . Coronary artery disease involving native coronary artery of native heart 02/17/2017   Overview:  By ct scan   . Dyspnea    prior to removal of fluids  . Elevated erythrocyte sedimentation rate 12/10/2017  . GERD (gastroesophageal reflux disease)    . Hyperlipemia   . Hyperlipidemia 05/31/2014  . Hypertension   . Hyperuricemia 12/10/2017  . 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 Renal disorder   . Secondary hyperparathyroidism ([place]) 07/21/2015   Overview:  Overview:  Followed by Dr. Holley Raring Overview:  Followed by Dr. Holley Raring  . Vitamin D deficiency 07/21/2015   Overview:  Overview:  Vit D 15 (checked thru nephrology, who is supplementing w/ ergocalciferol). Overview:  Vit D 15 (checked thru nephrology, who is supplementing w/ ergocalciferol).   Review of Systems:  Constitutional: Feels well. Cardiovascular: Denies chest pain, exertional chest pain.  Pulmonary: Denies hemoptysis, pleuritic chest pain.   The remainder of systems were reviewed and were found to be negative other than what is documented in the HPI.    Physical Examination:    LABORATORY PANEL:   CBC No results for input(s): WBC, HGB, HCT, PLT in the last 168 hours. ------------------------------------------------------------------------------------------------------------------  Chemistries  No results for input(s): NA, K, CL, CO2, GLUCOSE, BUN, CREATININE, CALCIUM, MG, AST, ALT, ALKPHOS, BILITOT in the last 168 hours.  Invalid input(s): GFRCGP ------------------------------------------------------------------------------------------------------------------  Cardiac Enzymes No results for input(s): TROPONINI in the last 168 hours. ------------------------------------------------------------  RADIOLOGY:  No results found.     Thank  you for the consultation and for allowing [facility] to assist in the care of your patient. Our recommendations are noted above.  Please contact us if we can be of further service.   Marda Stalker, M.D., F.C.C.P.  Board Certified in Internal [place], Pulmonary [place], [place], and Sleep [place].  [facility]  Office Number: [phone]   11/15/2018

## 2018-11-17 LAB — FUNGUS CULTURE WITH STAIN

## 2018-11-17 LAB — FUNGUS CULTURE RESULT

## 2018-11-17 LAB — FUNGAL ORGANISM REFLEX

## 2018-12-14 ENCOUNTER — Ambulatory Visit
Admission: RE | Admit: 2018-12-14 | Discharge: 2018-12-14 | Disposition: A | Discharge status: Home / Self Care | Payer: PPO | Source: Ambulatory Visit | Attending: Internal Medicine | Admitting: Internal Medicine

## 2018-12-14 ENCOUNTER — Other Ambulatory Visit: Payer: Self-pay

## 2018-12-14 ENCOUNTER — Ambulatory Visit (INDEPENDENT_AMBULATORY_CARE_PROVIDER_SITE_OTHER): Payer: PPO | Admitting: Internal Medicine

## 2018-12-14 DIAGNOSIS — J90 Pleural effusion, not elsewhere classified: Secondary | ICD-10-CM

## 2018-12-14 NOTE — Progress Notes (Signed)
[facility]    Virtual Visit via Telephone Note I connected with patient on 12/14/18 at 10:00 AM EDT by telephone and verified that I am speaking with the correct person using two identifiers.   I discussed the limitations, risks, security and privacy concerns of performing an evaluation and management service by telephone and the availability of in person appointments. I also discussed with the patient that there may be a patient responsible charge related to this service. The patient expressed understanding and agreed to proceed. I discussed the assessment and treatment plan with the patient. The patient was provided an opportunity to ask questions and all were answered. The patient agreed with the plan and demonstrated an understanding of the instructions. Please see note below for further detail.    The patient was advised to call back or seek an in-person evaluation if the symptoms worsen or if the condition fails to improve as anticipated.    Laverle Hobby, MD    Assessment and Plan:  Recurrent left pleural effusion, now s/p pleurodesis.  - No further intervention necessary at this time.  Right-sided pleural effusion. - Status post thoracentesis on 4/2.  Review of subsequent image shows continued resolution. - We will repeat chest x-ray in 3 weeks and have the patient follow-up with [place] after that.  She is asked to call us back sooner if she has dyspnea, and we will repeat a chest x-ray sooner.  If the fluid reaccumulates to a significant degree will likely need repeat thoracentesis.  Orders Placed This Encounter  Procedures  . DG Chest 2 View   Return in about 6 months (around 06/16/2019).     Date: 12/14/2018  MRN# [id number] Pamela Young [birth year]  Referring Physician: Dr. Estanislado Pandy for pleural effusion.   Pamela Young is a 68 y.o. old female seen in consultation for chief complaint of: dyspnea.      HPI:   The patient is a 68 year old  female with a recurrent left-sided pleural effusion status post multiple thoracenteses which were unrevealing.  She ultimately underwent a left-sided  pleurodesis on 09/27/2018.  SHe went back to the ED on 10/20/2018 with dyspnea.  She was found to have a large right-sided pleural effusion underwent ultrasound-guided thoracentesis on 4/2 with drainage of 1.7 L.  The fluid appeared to be exudative with elevated white count, cytology negative, flow cytometry negative, cultures negative. She presents now for routine follow-up, she has had a repeat chest x-ray on 5/27, which shows no evidence of recurrence.  She feels that her breathing has been doing well, and better than it has in some time. She has been getting out and walking more and tolerating it fairly well. She has no new complaints and that her breathing is feeling good.    Right-sided thoracentesis 10/20/2018, removal of 1.7 L Left-sided thoracentesis on 08/30/2018, removal of 800 cc. left-sided thoracentesis on 08/11/2018, with removal of 1600 cc  **Chest x-ray 12/14/2018>> imaging personally reviewed, mild hyperinflation consistent with emphysema.  Previously seen pleural effusion appears resolved. **CT chest 10/20/2018>> images personally reviewed, there is a large right-sided pleural effusion subsequent with pericardial effusion, chest x-ray on 4/17 shows a small amount of reaccumulation.  **Echocardiogram 10/21/2018>> EF 12% normal RV systolic function. **Thoracentesis 08/11/2018>> fluid appears exudative.  Predominantly lymphocytic, cytology negative, culture negative. **Chest x-ray 1/22, CT chest 08/11/2018>> there is a large left-sided pleural effusion, there is a possible mass within atelectatic lung, however it is obscured. **Echocardiogram 01/28/2017>> Ultrasound enhancing agent utilized to improve  endocardial border  definition  Left ventricular hypertrophy - mild  Normal left ventricular systolic function, ejection fraction 60 to 33%   Diastolic dysfunction - grade I (normal filling pressures)  Aortic sclerosis  Normal right ventricular systolic function  Mildly elevated right atrial pressure  Medication:    Current Outpatient Medications:  .  acetaminophen (TYLENOL) 500 MG tablet, Take 500 mg by mouth every 6 (six) hours as needed., Disp: , Rfl:  .  allopurinol (ZYLOPRIM) 300 MG tablet, Take 300 mg by mouth daily. , Disp: , Rfl: 2 .  amLODipine (NORVASC) 10 MG tablet, Take 10 mg by mouth daily., Disp: , Rfl:  .  atenolol (TENORMIN) 25 MG tablet, Take 25 mg by mouth daily., Disp: , Rfl:  .  atorvastatin (LIPITOR) 40 MG tablet, Take 1 tablet by mouth daily. Takes in evening, Disp: , Rfl: 4 .  Cholecalciferol (VITAMIN D3) 25 MCG (1000 UT) CAPS, Take 2,000 Int'l Units by mouth daily., Disp: , Rfl:  .  famotidine (PEPCID) 20 MG tablet, Take 1 tablet (20 mg total) by mouth daily., Disp: 30 tablet, Rfl: 1 .  furosemide (LASIX) 20 MG tablet, Take 1 tablet (20 mg total) by mouth daily., Disp: 30 tablet, Rfl: 11 .  hydroxychloroquine (PLAQUENIL) 200 MG tablet, Take by mouth daily. Takes in the evening for gout, Disp: , Rfl:  .  losartan (COZAAR) 100 MG tablet, Take 100 mg by mouth daily. Takes in the morning, Disp: , Rfl:  .  potassium chloride SA (K-DUR,KLOR-CON) 20 MEQ tablet, Take 1 tablet (20 mEq total) by mouth daily., Disp: 30 tablet, Rfl: 5 .  triamterene-hydrochlorothiazide (MAXZIDE) 75-50 MG tablet, Take 1 tablet by mouth daily., Disp: , Rfl:   Review of Systems:  Constitutional: Feels well. Cardiovascular: Denies chest pain, exertional chest pain.  Pulmonary: Denies hemoptysis, pleuritic chest pain.   The remainder of systems were reviewed and were found to be negative other than what is documented in the HPI.      Physical Examination:  --  LABORATORY PANEL:   CBC No results for input(s): WBC, HGB, HCT, PLT in the last 168 hours.  ------------------------------------------------------------------------------------------------------------------  Chemistries  No results for input(s): NA, K, CL, CO2, GLUCOSE, BUN, CREATININE, CALCIUM, MG, AST, ALT, ALKPHOS, BILITOT in the last 168 hours.  Invalid input(s): GFRCGP ------------------------------------------------------------------------------------------------------------------  Cardiac Enzymes No results for input(s): TROPONINI in the last 168 hours. ------------------------------------------------------------  RADIOLOGY:  Dg Chest 2 View  Result Date: 12/14/2018 CLINICAL DATA:  Pleural effusion.  History of left chest surgery. EXAM: CHEST - 2 VIEW COMPARISON:  11/04/2018 FINDINGS: Improved aeration at the lung bases. No significant pleural fluid. Lungs are clear bilaterally. Heart and mediastinum are within normal limits. Atherosclerotic calcifications at the aortic arch. Trachea is midline. No acute bone abnormality. IMPRESSION: No active cardiopulmonary disease. Pleural fluid has resolved. Electronically Signed   By: Markus Daft M.D.   On: 12/14/2018 08:34       Thank  you for the consultation and for allowing [facility] to assist in the care of your patient. Our recommendations are noted above.  Please contact us if we can be of further service.   Marda Stalker, M.D., F.C.C.P.  Board Certified in Internal Medicine, Pulmonary Medicine, [place], and Sleep Medicine.  [facility] Office Number: [phone]   12/14/2018

## 2018-12-14 NOTE — Patient Instructions (Signed)
We will repeat a chest x-ray in about 6 months time and see her back then. If your breathing gets worse before then, call us back sooner we will get a chest x-ray earlier.

## 2019-01-04 DIAGNOSIS — G510 Bell's palsy: Secondary | ICD-10-CM | POA: Diagnosis not present

## 2019-01-23 DIAGNOSIS — D631 Anemia in chronic kidney disease: Secondary | ICD-10-CM | POA: Diagnosis not present

## 2019-01-23 DIAGNOSIS — N183 Chronic kidney disease, stage 3 (moderate): Secondary | ICD-10-CM | POA: Diagnosis not present

## 2019-01-23 DIAGNOSIS — I10 Essential (primary) hypertension: Secondary | ICD-10-CM | POA: Diagnosis not present

## 2019-01-23 DIAGNOSIS — N2581 Secondary hyperparathyroidism of renal origin: Secondary | ICD-10-CM | POA: Diagnosis not present

## 2019-01-23 DIAGNOSIS — E872 Acidosis: Secondary | ICD-10-CM | POA: Diagnosis not present

## 2019-02-01 DIAGNOSIS — Z0000 Encounter for general adult medical examination without abnormal findings: Secondary | ICD-10-CM | POA: Diagnosis not present

## 2019-02-01 DIAGNOSIS — I2510 Atherosclerotic heart disease of native coronary artery without angina pectoris: Secondary | ICD-10-CM | POA: Diagnosis not present

## 2019-02-01 DIAGNOSIS — J90 Pleural effusion, not elsewhere classified: Secondary | ICD-10-CM | POA: Diagnosis not present

## 2019-02-01 DIAGNOSIS — J0100 Acute maxillary sinusitis, unspecified: Secondary | ICD-10-CM | POA: Diagnosis not present

## 2019-02-01 DIAGNOSIS — M1A00X0 Idiopathic chronic gout, unspecified site, without tophus (tophi): Secondary | ICD-10-CM | POA: Diagnosis not present

## 2019-02-01 DIAGNOSIS — I10 Essential (primary) hypertension: Secondary | ICD-10-CM | POA: Diagnosis not present

## 2019-02-01 DIAGNOSIS — E7800 Pure hypercholesterolemia, unspecified: Secondary | ICD-10-CM | POA: Diagnosis not present

## 2019-02-01 DIAGNOSIS — R946 Abnormal results of thyroid function studies: Secondary | ICD-10-CM | POA: Diagnosis not present

## 2019-02-01 DIAGNOSIS — M1990 Unspecified osteoarthritis, unspecified site: Secondary | ICD-10-CM | POA: Diagnosis not present

## 2019-02-01 DIAGNOSIS — E669 Obesity, unspecified: Secondary | ICD-10-CM | POA: Diagnosis not present

## 2019-02-01 DIAGNOSIS — N183 Chronic kidney disease, stage 3 (moderate): Secondary | ICD-10-CM | POA: Diagnosis not present

## 2019-02-01 DIAGNOSIS — Z79899 Other long term (current) drug therapy: Secondary | ICD-10-CM | POA: Diagnosis not present

## 2019-04-12 ENCOUNTER — Other Ambulatory Visit: Payer: Self-pay | Admitting: Internal Medicine

## 2019-04-12 DIAGNOSIS — E790 Hyperuricemia without signs of inflammatory arthritis and tophaceous disease: Secondary | ICD-10-CM | POA: Diagnosis not present

## 2019-04-12 DIAGNOSIS — Z79899 Other long term (current) drug therapy: Secondary | ICD-10-CM | POA: Diagnosis not present

## 2019-04-12 DIAGNOSIS — N183 Chronic kidney disease, stage 3 (moderate): Secondary | ICD-10-CM | POA: Diagnosis not present

## 2019-04-12 DIAGNOSIS — M0600 Rheumatoid arthritis without rheumatoid factor, unspecified site: Secondary | ICD-10-CM | POA: Diagnosis not present

## 2019-04-12 DIAGNOSIS — R768 Other specified abnormal immunological findings in serum: Secondary | ICD-10-CM | POA: Diagnosis not present

## 2019-04-17 ENCOUNTER — Other Ambulatory Visit: Payer: Self-pay | Admitting: Internal Medicine

## 2019-05-18 DIAGNOSIS — E872 Acidosis: Secondary | ICD-10-CM | POA: Diagnosis not present

## 2019-05-18 DIAGNOSIS — I10 Essential (primary) hypertension: Secondary | ICD-10-CM | POA: Diagnosis not present

## 2019-05-18 DIAGNOSIS — D631 Anemia in chronic kidney disease: Secondary | ICD-10-CM | POA: Diagnosis not present

## 2019-05-18 DIAGNOSIS — N1831 Chronic kidney disease, stage 3a: Secondary | ICD-10-CM | POA: Diagnosis not present

## 2019-05-18 DIAGNOSIS — N2581 Secondary hyperparathyroidism of renal origin: Secondary | ICD-10-CM | POA: Diagnosis not present

## 2019-08-01 ENCOUNTER — Other Ambulatory Visit (INDEPENDENT_AMBULATORY_CARE_PROVIDER_SITE_OTHER): Payer: Self-pay | Admitting: Nephrology

## 2019-08-01 DIAGNOSIS — I10 Essential (primary) hypertension: Secondary | ICD-10-CM

## 2019-08-02 ENCOUNTER — Ambulatory Visit (INDEPENDENT_AMBULATORY_CARE_PROVIDER_SITE_OTHER): Payer: PPO

## 2019-08-02 ENCOUNTER — Encounter (INDEPENDENT_AMBULATORY_CARE_PROVIDER_SITE_OTHER): Payer: Self-pay

## 2019-08-02 ENCOUNTER — Other Ambulatory Visit: Payer: Self-pay

## 2019-08-02 DIAGNOSIS — I10 Essential (primary) hypertension: Secondary | ICD-10-CM

## 2019-08-04 DIAGNOSIS — I10 Essential (primary) hypertension: Secondary | ICD-10-CM | POA: Diagnosis not present

## 2019-08-04 DIAGNOSIS — M0600 Rheumatoid arthritis without rheumatoid factor, unspecified site: Secondary | ICD-10-CM | POA: Diagnosis not present

## 2019-08-04 DIAGNOSIS — N184 Chronic kidney disease, stage 4 (severe): Secondary | ICD-10-CM | POA: Diagnosis not present

## 2019-08-04 DIAGNOSIS — Z79899 Other long term (current) drug therapy: Secondary | ICD-10-CM | POA: Diagnosis not present

## 2019-08-04 DIAGNOSIS — N2581 Secondary hyperparathyroidism of renal origin: Secondary | ICD-10-CM | POA: Diagnosis not present

## 2019-08-04 DIAGNOSIS — I2510 Atherosclerotic heart disease of native coronary artery without angina pectoris: Secondary | ICD-10-CM | POA: Diagnosis not present

## 2019-08-04 DIAGNOSIS — R946 Abnormal results of thyroid function studies: Secondary | ICD-10-CM | POA: Diagnosis not present

## 2019-08-04 DIAGNOSIS — E7800 Pure hypercholesterolemia, unspecified: Secondary | ICD-10-CM | POA: Diagnosis not present

## 2019-08-04 DIAGNOSIS — E669 Obesity, unspecified: Secondary | ICD-10-CM | POA: Diagnosis not present

## 2019-10-16 ENCOUNTER — Encounter: Payer: Self-pay | Admitting: Ophthalmology

## 2019-10-19 NOTE — Discharge Instructions (Signed)

## 2019-10-20 ENCOUNTER — Other Ambulatory Visit: Payer: Self-pay

## 2019-10-20 ENCOUNTER — Other Ambulatory Visit
Admission: RE | Admit: 2019-10-20 | Discharge: 2019-10-20 | Disposition: A | Discharge status: Home / Self Care | Payer: Medicare HMO | Source: Ambulatory Visit | Attending: Ophthalmology | Admitting: Ophthalmology

## 2019-10-20 DIAGNOSIS — Z01812 Encounter for preprocedural laboratory examination: Secondary | ICD-10-CM | POA: Diagnosis present

## 2019-10-20 DIAGNOSIS — Z20822 Contact with and (suspected) exposure to covid-19: Secondary | ICD-10-CM | POA: Diagnosis not present

## 2019-10-20 LAB — SARS CORONAVIRUS 2 (TAT 6-24 HRS): SARS Coronavirus 2: NEGATIVE

## 2019-10-24 ENCOUNTER — Encounter
Admission: RE | Disposition: A | Discharge status: Home / Self Care | Payer: Self-pay | Source: Home / Self Care | Attending: Ophthalmology

## 2019-10-24 ENCOUNTER — Ambulatory Visit
Admission: RE | Admit: 2019-10-24 | Discharge: 2019-10-24 | Disposition: A | Discharge status: Home / Self Care | Payer: Medicare HMO | Attending: Ophthalmology | Admitting: Ophthalmology

## 2019-10-24 ENCOUNTER — Ambulatory Visit: Payer: Medicare HMO | Admitting: Anesthesiology

## 2019-10-24 ENCOUNTER — Other Ambulatory Visit: Payer: Self-pay

## 2019-10-24 ENCOUNTER — Encounter: Payer: Self-pay | Admitting: Ophthalmology

## 2019-10-24 DIAGNOSIS — E7800 Pure hypercholesterolemia, unspecified: Secondary | ICD-10-CM | POA: Diagnosis not present

## 2019-10-24 DIAGNOSIS — Z885 Allergy status to narcotic agent status: Secondary | ICD-10-CM | POA: Insufficient documentation

## 2019-10-24 DIAGNOSIS — D649 Anemia, unspecified: Secondary | ICD-10-CM | POA: Insufficient documentation

## 2019-10-24 DIAGNOSIS — I2510 Atherosclerotic heart disease of native coronary artery without angina pectoris: Secondary | ICD-10-CM | POA: Diagnosis not present

## 2019-10-24 DIAGNOSIS — N289 Disorder of kidney and ureter, unspecified: Secondary | ICD-10-CM | POA: Diagnosis not present

## 2019-10-24 DIAGNOSIS — K219 Gastro-esophageal reflux disease without esophagitis: Secondary | ICD-10-CM | POA: Insufficient documentation

## 2019-10-24 DIAGNOSIS — R0602 Shortness of breath: Secondary | ICD-10-CM | POA: Diagnosis not present

## 2019-10-24 DIAGNOSIS — I10 Essential (primary) hypertension: Secondary | ICD-10-CM | POA: Diagnosis not present

## 2019-10-24 DIAGNOSIS — H2511 Age-related nuclear cataract, right eye: Secondary | ICD-10-CM | POA: Insufficient documentation

## 2019-10-24 DIAGNOSIS — M1990 Unspecified osteoarthritis, unspecified site: Secondary | ICD-10-CM | POA: Diagnosis not present

## 2019-10-24 DIAGNOSIS — G510 Bell's palsy: Secondary | ICD-10-CM | POA: Diagnosis not present

## 2019-10-24 DIAGNOSIS — M109 Gout, unspecified: Secondary | ICD-10-CM | POA: Diagnosis not present

## 2019-10-24 DIAGNOSIS — M069 Rheumatoid arthritis, unspecified: Secondary | ICD-10-CM | POA: Insufficient documentation

## 2019-10-24 DIAGNOSIS — Z87891 Personal history of nicotine dependence: Secondary | ICD-10-CM | POA: Insufficient documentation

## 2019-10-24 HISTORY — DX: Presence of dental prosthetic device (complete) (partial): Z97.2

## 2019-10-24 HISTORY — PX: CATARACT EXTRACTION W/PHACO: SHX586

## 2019-10-24 SURGERY — PHACOEMULSIFICATION, CATARACT, WITH IOL INSERTION
Anesthesia: Monitor Anesthesia Care | Site: Eye | Laterality: Right

## 2019-10-24 MED ORDER — FENTANYL CITRATE (PF) 100 MCG/2ML IJ SOLN
INTRAMUSCULAR | Status: DC | PRN
  Administered 2019-10-24 (×2): 25 ug via INTRAVENOUS
  Administered 2019-10-24: 50 ug via INTRAVENOUS

## 2019-10-24 MED ORDER — NA CHONDROIT SULF-NA HYALURON 40-17 MG/ML IO SOLN
INTRAOCULAR | Status: DC | PRN
  Administered 2019-10-24: 1 mL via INTRAOCULAR

## 2019-10-24 MED ORDER — ACETAMINOPHEN 160 MG/5ML PO SOLN: 325.0000 mg | Freq: Once | ORAL | Status: DC

## 2019-10-24 MED ORDER — TETRACAINE HCL 0.5 % OP SOLN
1.0000 [drp] | OPHTHALMIC | Status: DC | PRN
  Administered 2019-10-24 (×3): 1 [drp] via OPHTHALMIC

## 2019-10-24 MED ORDER — MIDAZOLAM HCL 2 MG/2ML IJ SOLN
INTRAMUSCULAR | Status: DC | PRN
  Administered 2019-10-24: 1 mg via INTRAVENOUS
  Administered 2019-10-24 (×2): .5 mg via INTRAVENOUS

## 2019-10-24 MED ORDER — TETRACAINE 0.5 % OP SOLN OPTIME - NO CHARGE
OPHTHALMIC | Status: DC | PRN
  Administered 2019-10-24: 2 [drp] via OPHTHALMIC

## 2019-10-24 MED ORDER — MOXIFLOXACIN HCL 0.5 % OP SOLN
OPHTHALMIC | Status: DC | PRN
  Administered 2019-10-24: 0.2 mL via OPHTHALMIC

## 2019-10-24 MED ORDER — LACTATED RINGERS IV SOLN: 10.0000 mL/h | INTRAVENOUS | Status: DC

## 2019-10-24 MED ORDER — ARMC OPHTHALMIC DILATING DROPS
1.0000 "application " | OPHTHALMIC | Status: DC | PRN
  Administered 2019-10-24 (×3): 1 via OPHTHALMIC

## 2019-10-24 MED ORDER — EPINEPHRINE PF 1 MG/ML IJ SOLN
INTRAOCULAR | Status: DC | PRN
  Administered 2019-10-24: 67 mL via OPHTHALMIC

## 2019-10-24 MED ORDER — TRYPAN BLUE 0.06 % OP SOLN
OPHTHALMIC | Status: DC | PRN
  Administered 2019-10-24: 0.5 mL via INTRAOCULAR

## 2019-10-24 MED ORDER — ACETAMINOPHEN 325 MG PO TABS: 325.0000 mg | ORAL_TABLET | Freq: Once | ORAL | Status: DC

## 2019-10-24 MED ORDER — BRIMONIDINE TARTRATE-TIMOLOL 0.2-0.5 % OP SOLN
OPHTHALMIC | Status: DC | PRN
  Administered 2019-10-24: 1 [drp] via OPHTHALMIC

## 2019-10-24 MED ORDER — PROVISC 10 MG/ML IO SOLN
INTRAOCULAR | Status: DC | PRN
  Administered 2019-10-24: 0.55 mL via INTRAOCULAR

## 2019-10-24 MED ORDER — LIDOCAINE HCL (PF) 2 % IJ SOLN
INTRAOCULAR | Status: DC | PRN
  Administered 2019-10-24: 2 mL via INTRAOCULAR

## 2019-10-24 SURGICAL SUPPLY — 17 items
DISSECTOR HYDRO NUCLEUS 50X22 (MISCELLANEOUS) ×12 IMPLANT
DRSG TEGADERM 2-3/8X2-3/4 SM (GAUZE/BANDAGES/DRESSINGS) ×3 IMPLANT
GLOVE BIOGEL PI IND STRL 8 (GLOVE) ×1 IMPLANT
GLOVE BIOGEL PI INDICATOR 8 (GLOVE) ×2
GOWN STRL REUS W/ TWL LRG LVL3 (GOWN DISPOSABLE) ×1 IMPLANT
GOWN STRL REUS W/ TWL XL LVL3 (GOWN DISPOSABLE) ×1 IMPLANT
GOWN STRL REUS W/TWL LRG LVL3 (GOWN DISPOSABLE) ×2
GOWN STRL REUS W/TWL XL LVL3 (GOWN DISPOSABLE) ×2
KNIFE 45D UP 2.3 (MISCELLANEOUS) ×3 IMPLANT
LENS IOL DIOP 22.0 (Intraocular Lens) ×3 IMPLANT
LENS IOL TECNIS MONO 22.0 (Intraocular Lens) IMPLANT
PACK CATARACT (MISCELLANEOUS) ×3 IMPLANT
PACK DR. KING ARMS (PACKS) ×3 IMPLANT
PACK EYE AFTER SURG (MISCELLANEOUS) ×3 IMPLANT
SOLUTION OPHTHALMIC SALT (MISCELLANEOUS) ×3 IMPLANT
WATER STERILE IRR 250ML POUR (IV SOLUTION) ×3 IMPLANT
WIPE NON LINTING 3.25X3.25 (MISCELLANEOUS) ×3 IMPLANT

## 2019-10-24 NOTE — H&P (Signed)
   I have reviewed the patient's H&P and agree with its findings. There have been no interval changes.  Karie Skowron MD Ophthalmology 

## 2019-10-24 NOTE — Anesthesia Postprocedure Evaluation (Signed)
Anesthesia Post Note  Patient: Pamela Young  Procedure(s) Performed: CATARACT EXTRACTION PHACO AND INTRAOCULAR LENS PLACEMENT (IOC) RIGHT VISION BLUE 5.82 00:37.3  (Right Eye)     Patient location during evaluation: PACU Anesthesia Type: MAC Level of consciousness: awake and alert and oriented Pain management: satisfactory to patient Vital Signs Assessment: post-procedure vital signs reviewed and stable Respiratory status: spontaneous breathing, nonlabored ventilation and respiratory function stable Cardiovascular status: blood pressure returned to baseline and stable Postop Assessment: Adequate PO intake and No signs of nausea or vomiting Anesthetic complications: no    Raliegh Ip

## 2019-10-24 NOTE — Anesthesia Preprocedure Evaluation (Signed)
Anesthesia Evaluation  Patient identified by MRN, date of birth, ID band Patient awake    Reviewed: Allergy & Precautions, H&P , NPO status , Patient's Chart, lab work & pertinent test results  Airway Mallampati: II  TM Distance: >3 FB Neck ROM: full    Dental  (+) Upper Dentures, Lower Dentures   Pulmonary shortness of breath, former smoker,    Pulmonary exam normal breath sounds clear to auscultation       Cardiovascular hypertension, + CAD  Normal cardiovascular exam Rhythm:regular Rate:Normal     Neuro/Psych    GI/Hepatic GERD  ,  Endo/Other    Renal/GU Renal disease     Musculoskeletal  (+) Arthritis ,   Abdominal   Peds  Hematology  (+) Blood dyscrasia, anemia ,   Anesthesia Other Findings   Reproductive/Obstetrics                             Anesthesia Physical Anesthesia Plan  ASA: III  Anesthesia Plan: MAC   Post-op Pain Management:    Induction:   PONV Risk Score and Plan: 2 and Treatment may vary due to age or medical condition, TIVA and Midazolam  Airway Management Planned:   Additional Equipment:   Intra-op Plan:   Post-operative Plan:   Informed Consent: I have reviewed the patients History and Physical, chart, labs and discussed the procedure including the risks, benefits and alternatives for the proposed anesthesia with the patient or authorized representative who has indicated his/her understanding and acceptance.     Dental Advisory Given  Plan Discussed with: CRNA  Anesthesia Plan Comments:         Anesthesia Quick Evaluation

## 2019-10-24 NOTE — Anesthesia Procedure Notes (Signed)
Procedure Name: MAC Date/Time: 10/24/2019 12:25 PM Performed by: Vanetta Shawl, CRNA Pre-anesthesia Checklist: Patient identified, Emergency Drugs available, Suction available, Timeout performed and Patient being monitored Patient Re-evaluated:Patient Re-evaluated prior to induction Oxygen Delivery Method: Nasal cannula Placement Confirmation: positive ETCO2

## 2019-10-24 NOTE — Op Note (Signed)
  PREOPERATIVE DIAGNOSIS:  Nuclear sclerotic cataract of the RIGHT eye.   POSTOPERATIVE DIAGNOSIS:  Nuclear sclerotic cataract of the RIGHT eye.   OPERATIVE PROCEDURE: Cataract surgery OD   SURGEON:  Marchia Meiers, MD.   ANESTHESIA:  Anesthesiologist: Ronelle Nigh, MD CRNA: Vanetta Shawl, CRNA  1.      Managed anesthesia care. 2.     0.57ml of Shugarcaine was instilled following the paracentesis   COMPLICATIONS:  None.   TECHNIQUE:   Divide and conquer   DESCRIPTION OF PROCEDURE:  The patient was examined and consented in the preoperative holding area where the aforementioned topical anesthesia was applied to the RIGHT eye and then brought back to the Operating Room where the RIGHT eye was prepped and draped in the usual sterile ophthalmic fashion and a lid speculum was placed. A paracentesis was created with the side port blade, the anterior chamber was washed out with trypan blue to stain the anterior capsule, and the anterior chamber was filled with viscoelastic. A near clear corneal incision was performed with the steel keratome. A continuous curvilinear capsulorrhexis was performed with a cystotome followed by the capsulorrhexis forceps. Hydrodissection and hydrodelineation were carried out with BSS on a blunt cannula. The lens was removed in a divide and conquer  technique and the remaining cortical material was removed with the irrigation-aspiration handpiece. The capsular bag was inflated with viscoelastic and the lens was placed in the capsular bag without complication. The remaining viscoelastic was removed from the eye with the irrigation-aspiration handpiece. The wounds were hydrated. The anterior chamber was flushed and the eye was inflated to physiologic pressure. 0.60ml Vigamox was placed in the anterior chamber. The wounds were found to be water tight. The eye was dressed with Vigamox. The patient was given protective glasses to wear throughout the day and a shield with which  to sleep tonight. The patient was also given drops with which to begin a drop regimen today and will follow-up with me in one day. Implant Name Type Inv. Item Serial No. Manufacturer Lot No. LRB No. Used Action  LENS IOL DIOP 22.0 - [id number] Intraocular Lens LENS IOL DIOP 22.0 [id number] AMO  Right 1 Implanted    Procedure(s): CATARACT EXTRACTION PHACO AND INTRAOCULAR LENS PLACEMENT (IOC) RIGHT VISION BLUE 5.82 00:37.3  (Right)  Electronically signed: Marchia Meiers 10/24/2019 12:56 PM

## 2019-10-24 NOTE — Transfer of Care (Signed)
Immediate Anesthesia Transfer of Care Note  Patient: Pamela Young  Procedure(s) Performed: CATARACT EXTRACTION PHACO AND INTRAOCULAR LENS PLACEMENT (IOC) RIGHT VISION BLUE 5.82 00:37.3  (Right Eye)  Patient Location: PACU  Anesthesia Type: MAC  Level of Consciousness: awake, alert  and patient cooperative  Airway and Oxygen Therapy: Patient Spontanous Breathing   Post-op Assessment: Post-op Vital signs reviewed, Patient's Cardiovascular Status Stable, Respiratory Function Stable, Patent Airway and No signs of Nausea or vomiting  Post-op Vital Signs: Reviewed and stable  Complications: No apparent anesthesia complications

## 2019-10-25 ENCOUNTER — Encounter: Payer: Self-pay | Admitting: *Deleted

## 2019-11-15 ENCOUNTER — Encounter: Payer: Self-pay | Admitting: Ophthalmology

## 2019-11-15 ENCOUNTER — Other Ambulatory Visit: Payer: Self-pay

## 2019-11-21 ENCOUNTER — Other Ambulatory Visit
Admission: RE | Admit: 2019-11-21 | Discharge: 2019-11-21 | Disposition: A | Discharge status: Home / Self Care | Payer: Medicare HMO | Source: Ambulatory Visit | Attending: Ophthalmology | Admitting: Ophthalmology

## 2019-11-21 ENCOUNTER — Other Ambulatory Visit: Payer: Self-pay

## 2019-11-21 DIAGNOSIS — Z01812 Encounter for preprocedural laboratory examination: Secondary | ICD-10-CM | POA: Diagnosis present

## 2019-11-21 DIAGNOSIS — Z20822 Contact with and (suspected) exposure to covid-19: Secondary | ICD-10-CM | POA: Diagnosis not present

## 2019-11-21 LAB — SARS CORONAVIRUS 2 (TAT 6-24 HRS): SARS Coronavirus 2: NEGATIVE

## 2019-11-21 NOTE — Discharge Instructions (Signed)

## 2019-11-23 ENCOUNTER — Ambulatory Visit: Payer: Medicare HMO | Admitting: Anesthesiology

## 2019-11-23 ENCOUNTER — Encounter
Admission: RE | Disposition: A | Discharge status: Home / Self Care | Payer: Self-pay | Source: Home / Self Care | Attending: Ophthalmology

## 2019-11-23 ENCOUNTER — Ambulatory Visit
Admission: RE | Admit: 2019-11-23 | Discharge: 2019-11-23 | Disposition: A | Discharge status: Home / Self Care | Payer: Medicare HMO | Attending: Ophthalmology | Admitting: Ophthalmology

## 2019-11-23 ENCOUNTER — Encounter: Payer: Self-pay | Admitting: Ophthalmology

## 2019-11-23 ENCOUNTER — Other Ambulatory Visit: Payer: Self-pay

## 2019-11-23 DIAGNOSIS — H2512 Age-related nuclear cataract, left eye: Secondary | ICD-10-CM | POA: Diagnosis not present

## 2019-11-23 DIAGNOSIS — Z79899 Other long term (current) drug therapy: Secondary | ICD-10-CM | POA: Diagnosis not present

## 2019-11-23 DIAGNOSIS — E7800 Pure hypercholesterolemia, unspecified: Secondary | ICD-10-CM | POA: Diagnosis not present

## 2019-11-23 DIAGNOSIS — Z6839 Body mass index (BMI) 39.0-39.9, adult: Secondary | ICD-10-CM | POA: Diagnosis not present

## 2019-11-23 DIAGNOSIS — N184 Chronic kidney disease, stage 4 (severe): Secondary | ICD-10-CM | POA: Diagnosis not present

## 2019-11-23 DIAGNOSIS — Z87891 Personal history of nicotine dependence: Secondary | ICD-10-CM | POA: Insufficient documentation

## 2019-11-23 DIAGNOSIS — M1990 Unspecified osteoarthritis, unspecified site: Secondary | ICD-10-CM | POA: Diagnosis not present

## 2019-11-23 DIAGNOSIS — I129 Hypertensive chronic kidney disease with stage 1 through stage 4 chronic kidney disease, or unspecified chronic kidney disease: Secondary | ICD-10-CM | POA: Diagnosis not present

## 2019-11-23 HISTORY — PX: CATARACT EXTRACTION W/PHACO: SHX586

## 2019-11-23 SURGERY — PHACOEMULSIFICATION, CATARACT, WITH IOL INSERTION
Anesthesia: Monitor Anesthesia Care | Site: Eye | Laterality: Left

## 2019-11-23 MED ORDER — BRIMONIDINE TARTRATE-TIMOLOL 0.2-0.5 % OP SOLN
OPHTHALMIC | Status: DC | PRN
  Administered 2019-11-23: 1 [drp] via OPHTHALMIC

## 2019-11-23 MED ORDER — PROVISC 10 MG/ML IO SOLN
INTRAOCULAR | Status: DC | PRN
  Administered 2019-11-23: 0.55 mL via INTRAOCULAR

## 2019-11-23 MED ORDER — LACTATED RINGERS IV SOLN: INTRAVENOUS | Status: DC

## 2019-11-23 MED ORDER — TETRACAINE HCL 0.5 % OP SOLN
1.0000 [drp] | OPHTHALMIC | Status: DC | PRN
  Administered 2019-11-23 (×3): 1 [drp] via OPHTHALMIC

## 2019-11-23 MED ORDER — LIDOCAINE HCL (PF) 2 % IJ SOLN
INTRAOCULAR | Status: DC | PRN
  Administered 2019-11-23: 12:00:00 1 mL via INTRAOCULAR

## 2019-11-23 MED ORDER — ARMC OPHTHALMIC DILATING DROPS
1.0000 "application " | OPHTHALMIC | Status: DC | PRN
  Administered 2019-11-23 (×3): 1 via OPHTHALMIC

## 2019-11-23 MED ORDER — TETRACAINE 0.5 % OP SOLN OPTIME - NO CHARGE
OPHTHALMIC | Status: DC | PRN
  Administered 2019-11-23: 12:00:00 2 [drp] via OPHTHALMIC

## 2019-11-23 MED ORDER — EPINEPHRINE PF 1 MG/ML IJ SOLN
INTRAOCULAR | Status: DC | PRN
  Administered 2019-11-23: 12:00:00 68 mL via OPHTHALMIC

## 2019-11-23 MED ORDER — MIDAZOLAM HCL 2 MG/2ML IJ SOLN
INTRAMUSCULAR | Status: DC | PRN
  Administered 2019-11-23: 1 mg via INTRAVENOUS

## 2019-11-23 MED ORDER — NA CHONDROIT SULF-NA HYALURON 40-17 MG/ML IO SOLN
INTRAOCULAR | Status: DC | PRN
  Administered 2019-11-23: 1 mL via INTRAOCULAR

## 2019-11-23 MED ORDER — MOXIFLOXACIN HCL 0.5 % OP SOLN
OPHTHALMIC | Status: DC | PRN
  Administered 2019-11-23: 0.2 mL via OPHTHALMIC

## 2019-11-23 MED ORDER — NEOMYCIN-POLYMYXIN-DEXAMETH 3.5-10000-0.1 OP OINT
TOPICAL_OINTMENT | OPHTHALMIC | Status: DC | PRN
  Administered 2019-11-23: 1 via OPHTHALMIC

## 2019-11-23 MED ORDER — TRYPAN BLUE 0.06 % OP SOLN
OPHTHALMIC | Status: DC | PRN
  Administered 2019-11-23: 0.5 mL via INTRAOCULAR

## 2019-11-23 MED ORDER — FENTANYL CITRATE (PF) 100 MCG/2ML IJ SOLN
INTRAMUSCULAR | Status: DC | PRN
  Administered 2019-11-23: 50 ug via INTRAVENOUS

## 2019-11-23 SURGICAL SUPPLY — 17 items
DISSECTOR HYDRO NUCLEUS 50X22 (MISCELLANEOUS) ×8 IMPLANT
DRSG TEGADERM 2-3/8X2-3/4 SM (GAUZE/BANDAGES/DRESSINGS) ×2 IMPLANT
GLOVE BIOGEL PI IND STRL 8 (GLOVE) ×1 IMPLANT
GLOVE BIOGEL PI INDICATOR 8 (GLOVE) ×1
GOWN STRL REUS W/ TWL LRG LVL3 (GOWN DISPOSABLE) ×1 IMPLANT
GOWN STRL REUS W/ TWL XL LVL3 (GOWN DISPOSABLE) ×1 IMPLANT
GOWN STRL REUS W/TWL LRG LVL3 (GOWN DISPOSABLE) ×1
GOWN STRL REUS W/TWL XL LVL3 (GOWN DISPOSABLE) ×1
KNIFE 45D UP 2.3 (MISCELLANEOUS) ×2 IMPLANT
LENS IOL TECNIS 22.5 (Intraocular Lens) ×2 IMPLANT
LENS IOL TECNIS MONO 1P 22.5 (Intraocular Lens) IMPLANT
PACK CATARACT (MISCELLANEOUS) ×2 IMPLANT
PACK DR. KING ARMS (PACKS) ×2 IMPLANT
PACK EYE AFTER SURG (MISCELLANEOUS) ×2 IMPLANT
SOLUTION OPHTHALMIC SALT (MISCELLANEOUS) ×2 IMPLANT
WATER STERILE IRR 250ML POUR (IV SOLUTION) ×2 IMPLANT
WIPE NON LINTING 3.25X3.25 (MISCELLANEOUS) ×2 IMPLANT

## 2019-11-23 NOTE — Anesthesia Postprocedure Evaluation (Signed)
Anesthesia Post Note  Patient: Pamela Young  Procedure(s) Performed: CATARACT EXTRACTION PHACO AND INTRAOCULAR LENS PLACEMENT ([place]) LEFT VISION BLUE (Left Eye)     Patient location during evaluation: PACU Anesthesia Type: MAC Level of consciousness: awake and alert Pain management: pain level controlled Vital Signs Assessment: post-procedure vital signs reviewed and stable Respiratory status: spontaneous breathing, nonlabored ventilation, respiratory function stable and patient connected to nasal cannula oxygen Cardiovascular status: stable and blood pressure returned to baseline Postop Assessment: no apparent nausea or vomiting Anesthetic complications: no    Giovannina Mun

## 2019-11-23 NOTE — Anesthesia Procedure Notes (Signed)
Procedure Name: MAC Performed by: Rayaan Lorah, CRNA Pre-anesthesia Checklist: Patient identified, Emergency Drugs available, Suction available, Timeout performed and Patient being monitored Patient Re-evaluated:Patient Re-evaluated prior to induction Oxygen Delivery Method: Nasal cannula Placement Confirmation: positive ETCO2       

## 2019-11-23 NOTE — Anesthesia Preprocedure Evaluation (Signed)
Anesthesia Evaluation  Patient identified by MRN, date of birth, ID band Patient awake    Reviewed: NPO status   History of Anesthesia Complications Negative for: history of anesthetic complications  Airway Mallampati: II  TM Distance: >3 FB Neck ROM: full    Dental no notable dental hx.    Pulmonary neg pulmonary ROS, former smoker,  Chronic bilateral pleural effusions 08/2018  > THORACENTESIS  >  THORACOTOMY 09/27/2018 ; CHEST TUBE INSERTION 09/27/2018    Pulmonary exam normal        Cardiovascular Exercise Tolerance: Good hypertension, + CAD (??)  Normal cardiovascular exam  echo: 10/2018: 1. The left ventricle has hyperdynamic systolic function, with an  ejection fraction of >65%. The cavity size was normal. There is concentric  left ventricular hypertrophy. Left ventricular diastolic Doppler  parameters are consistent with impaired  relaxation.  2. The right ventricle has normal systolic function. The cavity was  normal. There is no increase in right ventricular wall thickness. ;      Neuro/Psych negative neurological ROS  negative psych ROS   GI/Hepatic Neg liver ROS, GERD  Controlled,  Endo/Other  Morbid obesity (bmi 39)  Renal/GU CRFRenal disease (ckd4;  60% left renal artery stenosis)  negative genitourinary   Musculoskeletal  (+) Arthritis  (rheumatoid arthritis,), Gout    Abdominal   Peds  Hematology negative hematology ROS (+)   Anesthesia Other Findings SARS Coronavirus  NEGATIVE    pcp note: Sallee Lange, NP - 08/04/2019;    Reproductive/Obstetrics                             Anesthesia Physical Anesthesia Plan  ASA: III  Anesthesia Plan: MAC   Post-op Pain Management:    Induction:   PONV Risk Score and Plan: 2 and TIVA and Midazolam  Airway Management Planned:   Additional Equipment:   Intra-op Plan:   Post-operative Plan:   Informed  Consent: I have reviewed the patients History and Physical, chart, labs and discussed the procedure including the risks, benefits and alternatives for the proposed anesthesia with the patient or authorized representative who has indicated his/her understanding and acceptance.       Plan Discussed with: CRNA  Anesthesia Plan Comments:         Anesthesia Quick Evaluation

## 2019-11-23 NOTE — Transfer of Care (Signed)
Immediate Anesthesia Transfer of Care Note  Patient: Pamela Young  Procedure(s) Performed: CATARACT EXTRACTION PHACO AND INTRAOCULAR LENS PLACEMENT ([place]) LEFT VISION BLUE (Left Eye)  Patient Location: PACU  Anesthesia Type: MAC  Level of Consciousness: awake, alert  and patient cooperative  Airway and Oxygen Therapy: Patient Spontanous Breathing and Patient connected to supplemental oxygen  Post-op Assessment: Post-op Vital signs reviewed, Patient's Cardiovascular Status Stable, Respiratory Function Stable, Patent Airway and No signs of Nausea or vomiting  Post-op Vital Signs: Reviewed and stable  Complications: No apparent anesthesia complications

## 2019-11-23 NOTE — Op Note (Signed)
  PREOPERATIVE DIAGNOSIS:  Nuclear sclerotic cataract of the LEFT eye.   POSTOPERATIVE DIAGNOSIS:  Nuclear sclerotic cataract of the LEFT eye.   OPERATIVE PROCEDURE: Cataract surgery OS   SURGEON:  Marchia Meiers, MD.   ANESTHESIA:  Anesthesiologist: Fidel Levy, MD CRNA: Mayme Genta, CRNA  1.      Managed anesthesia care. 2.     0.73ml of Shugarcaine was instilled following the paracentesis   COMPLICATIONS:  None.   TECHNIQUE:   Divide and conquer   DESCRIPTION OF PROCEDURE:  The patient was examined and consented in the preoperative holding area where the aforementioned topical anesthesia was applied to the LEFT eye and then brought back to the Operating Room where the left eye was prepped and draped in the usual sterile ophthalmic fashion and a lid speculum was placed. A paracentesis was created with the side port blade, the anterior chamber was washed out with trypan blue to stain the anterior capsule, and the anterior chamber was filled with viscoelastic. A near clear corneal incision was performed with the steel keratome. A continuous curvilinear capsulorrhexis was performed with a cystotome followed by the capsulorrhexis forceps. Hydrodissection and hydrodelineation were carried out with BSS on a blunt cannula. The lens was removed in a divide and conquer  technique and the remaining cortical material was removed with the irrigation-aspiration handpiece. The capsular bag was inflated with viscoelastic and the lens was placed in the capsular bag without complication. The remaining viscoelastic was removed from the eye with the irrigation-aspiration handpiece. The wounds were hydrated. The anterior chamber was flushed and the eye was inflated to physiologic pressure. 0.21ml Vigamox was placed in the anterior chamber. The wounds were found to be water tight. The eye was dressed with Vigamox. The patient was given protective glasses to wear throughout the day and a shield with which to  sleep tonight. The patient was also given drops with which to begin a drop regimen today and will follow-up with me in one day. Implant Name Type Inv. Item Serial No. Manufacturer Lot No. LRB No. Used Action  LENS IOL TECNIS 22.5 - [id number] Intraocular Lens LENS IOL TECNIS 22.5 [id number] AMO  Left 1 Implanted    Procedure(s): CATARACT EXTRACTION PHACO AND INTRAOCULAR LENS PLACEMENT (IOC) LEFT VISION BLUE (Left)  Electronically signed: Marchia Meiers 11/23/2019 12:36 PM

## 2019-11-23 NOTE — H&P (Signed)
   I have reviewed the patient's H&P and agree with its findings. There have been no interval changes.  Pihu Basil MD Ophthalmology 

## 2019-11-24 ENCOUNTER — Encounter: Payer: Self-pay | Admitting: *Deleted

## 2020-08-27 IMAGING — DX DG CHEST 1V PORT
1 series · 1 of 1 positions shown · non-contrast
Comparison: Chest radiograph 08/10/2018 and chest CT 08/11/2018

CLINICAL DATA: Status post thoracentesis.

EXAM:
PORTABLE CHEST 1 VIEW

[chest ap]
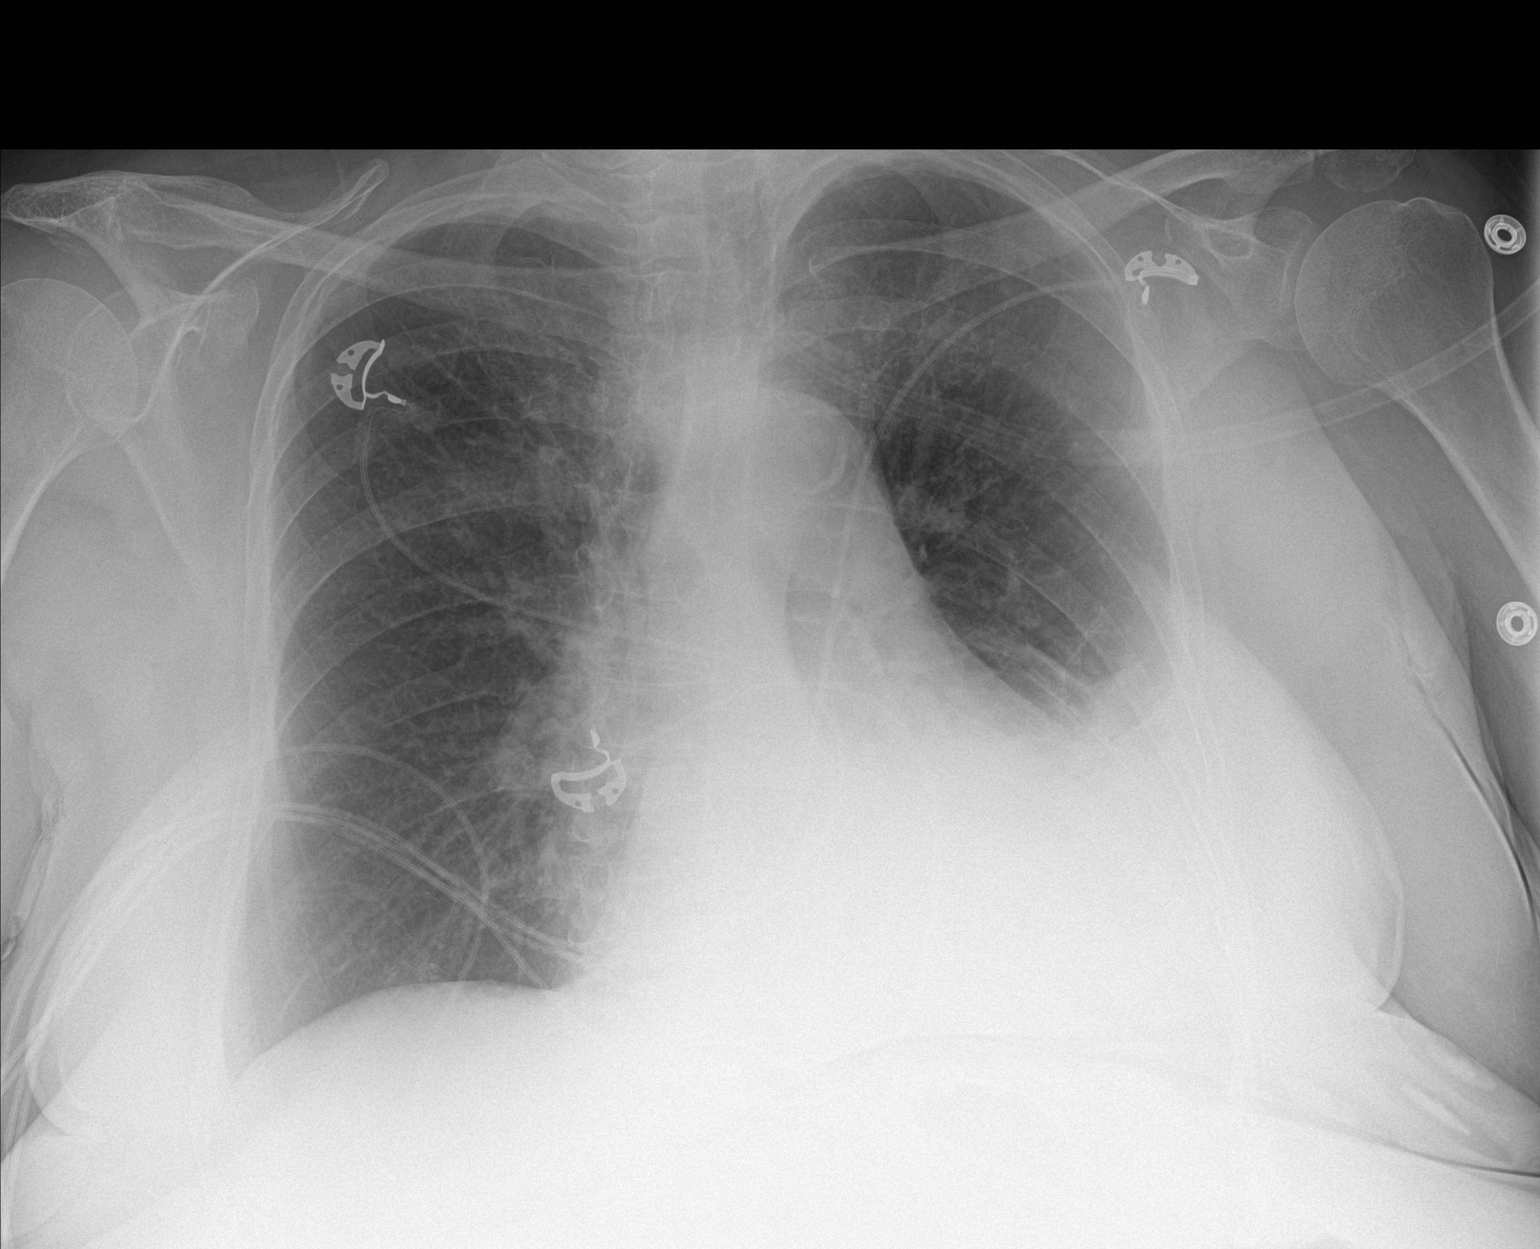

[1 of 1 positions shown; findings below may reference images not displayed]

FINDINGS: The left heart border remains obscured. Aortic atherosclerosis is
noted. There is a moderate residual left pleural effusion, decreased
from prior with improved aeration of the left midlung. There is
persistent atelectasis in the left lower lung. The right lung
remains clear. No pneumothorax is identified.
IMPRESSION: Decreased size of left pleural effusion following thoracentesis. No
pneumothorax.

## 2020-08-27 IMAGING — US US THORACENTESIS ASP PLEURAL SPACE W/IMG GUIDE
1 series · 4 of 4 positions shown · non-contrast
Comparison: none

INDICATION: Shortness of breath and large left pleural effusion.

[Series 1: us thoracentesis asp pleural space w/img guide · 4 of 4 slices shown]
[im 1/4]
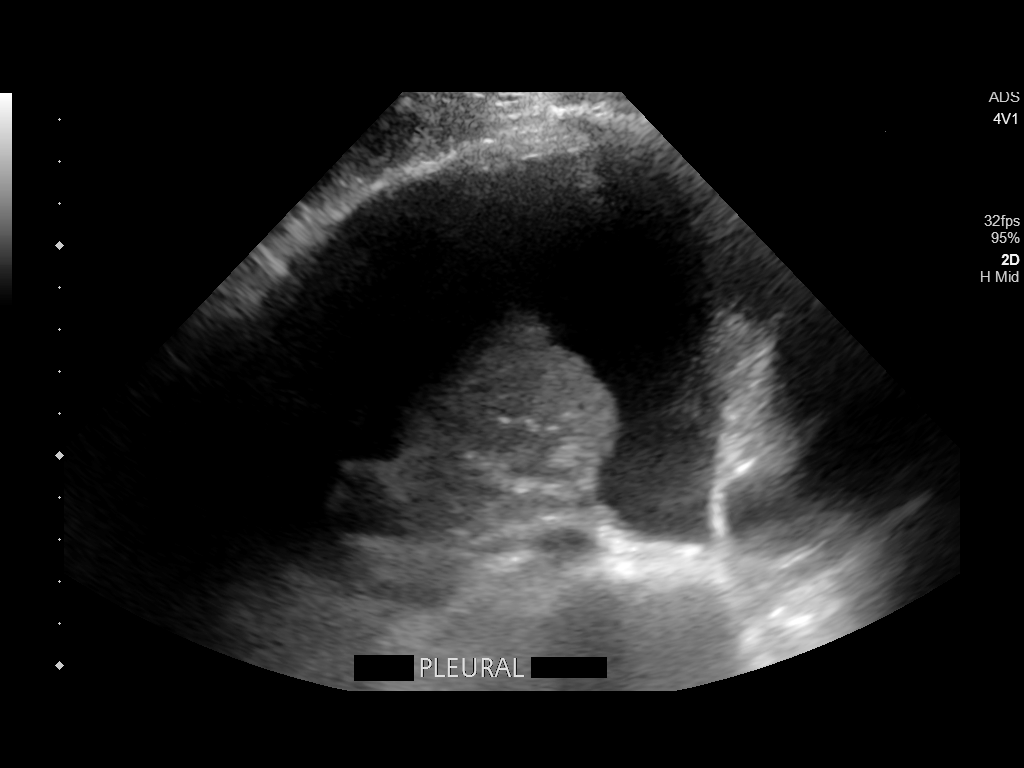
[im 2/4]
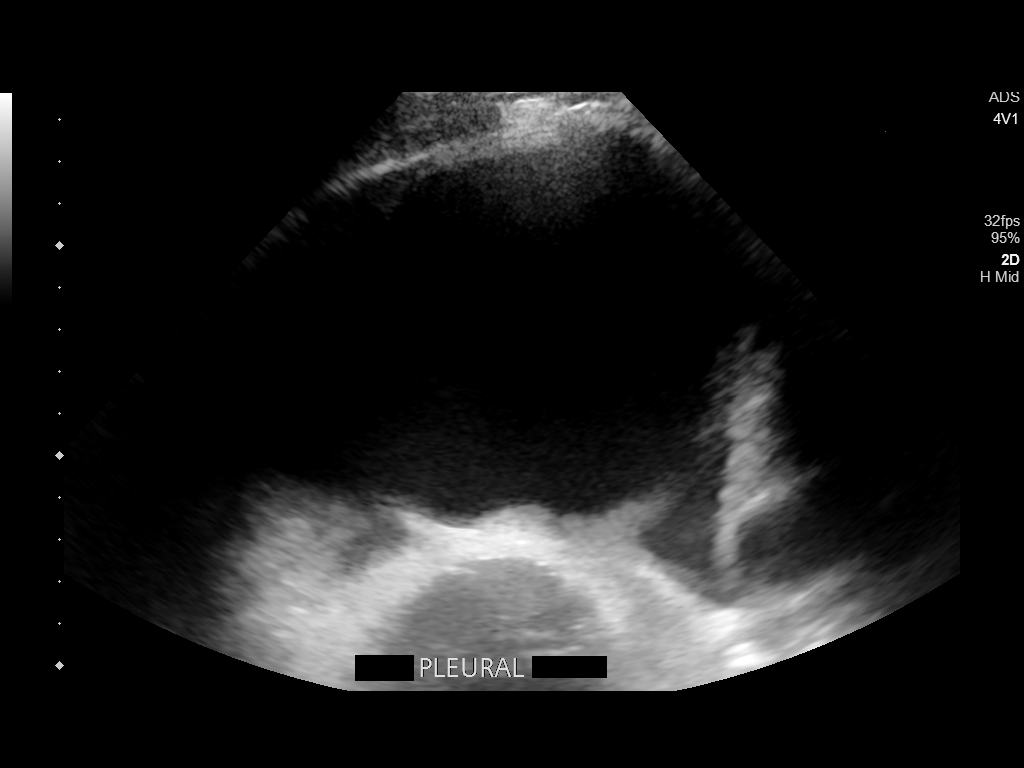
[im 3/4]
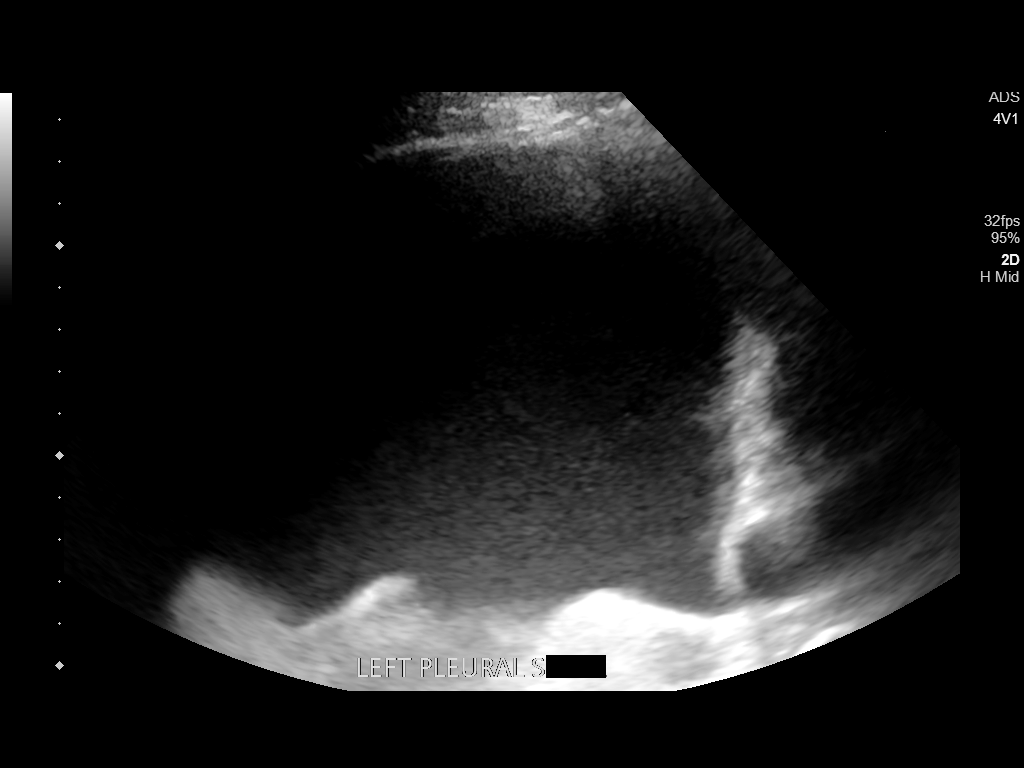
[im 4/4]
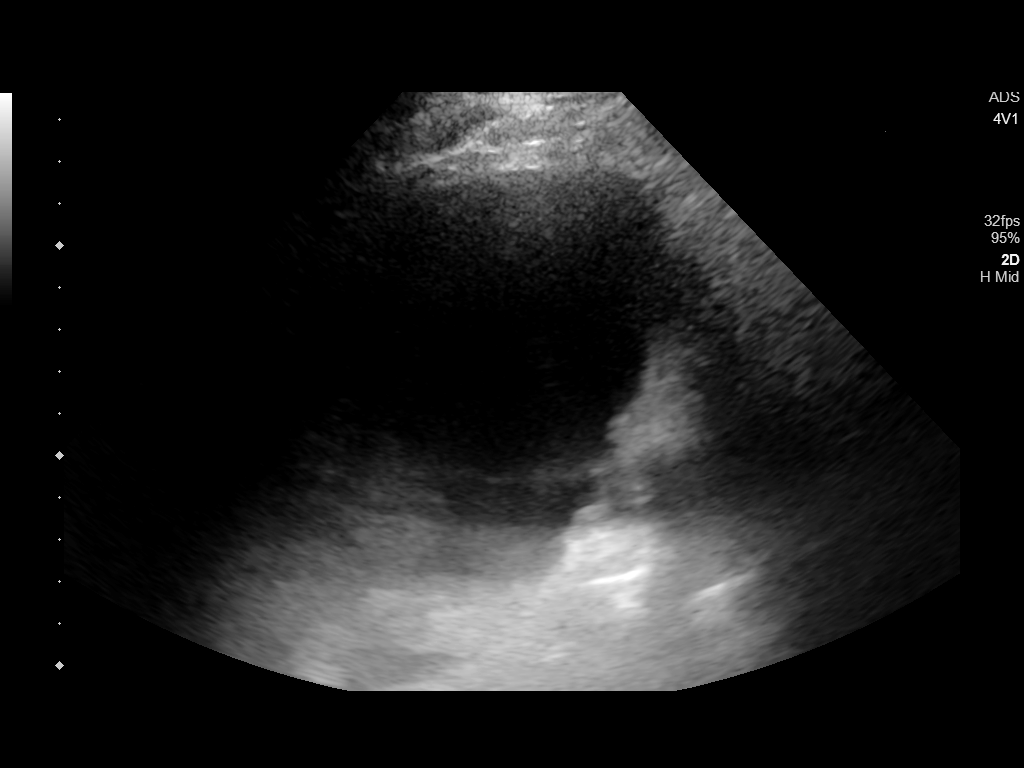

[4 of 4 positions shown; findings below may reference images not displayed]

EXAM:
ULTRASOUND GUIDED LEFT THORACENTESIS

MEDICATIONS:
None.

COMPLICATIONS:
None immediate.

PROCEDURE:
An ultrasound guided thoracentesis was thoroughly discussed with the
patient and questions answered. The benefits, risks, alternatives
and complications were also discussed. The patient understands and
wishes to proceed with the procedure. Written consent was obtained.

Ultrasound was performed to localize and mark an adequate pocket of
fluid in the left chest. The area was then prepped and draped in the
normal sterile fashion. 1% Lidocaine was used for local anesthesia.
Under ultrasound guidance a 6 Fr Safe-T-Centesis catheter was
introduced. Thoracentesis was performed. Procedure was stopped when
the patient started coughing. The catheter was removed and a
dressing applied.
FINDINGS: A total of approximately 1.6 L of yellow fluid was removed. Samples
were sent to the laboratory as requested by the clinical team.

Large amount of residual pleural fluid.
IMPRESSION: Successful ultrasound guided left thoracentesis yielding 1.6 L of
pleural fluid. Large amount of residual pleural fluid at the end of
the procedure.

## 2020-09-10 ENCOUNTER — Other Ambulatory Visit (INDEPENDENT_AMBULATORY_CARE_PROVIDER_SITE_OTHER): Payer: Self-pay | Admitting: Nephrology

## 2020-09-10 DIAGNOSIS — I701 Atherosclerosis of renal artery: Secondary | ICD-10-CM

## 2020-09-20 IMAGING — CR DG CHEST 2V
2 series · 2 of 2 positions shown · non-contrast
Comparison: CT 08/30/2018, radiograph 08/11/2018, CT 08/11/2018

CLINICAL DATA: Pain the radiates to back

EXAM:
CHEST - 2 VIEW

[chest pa]
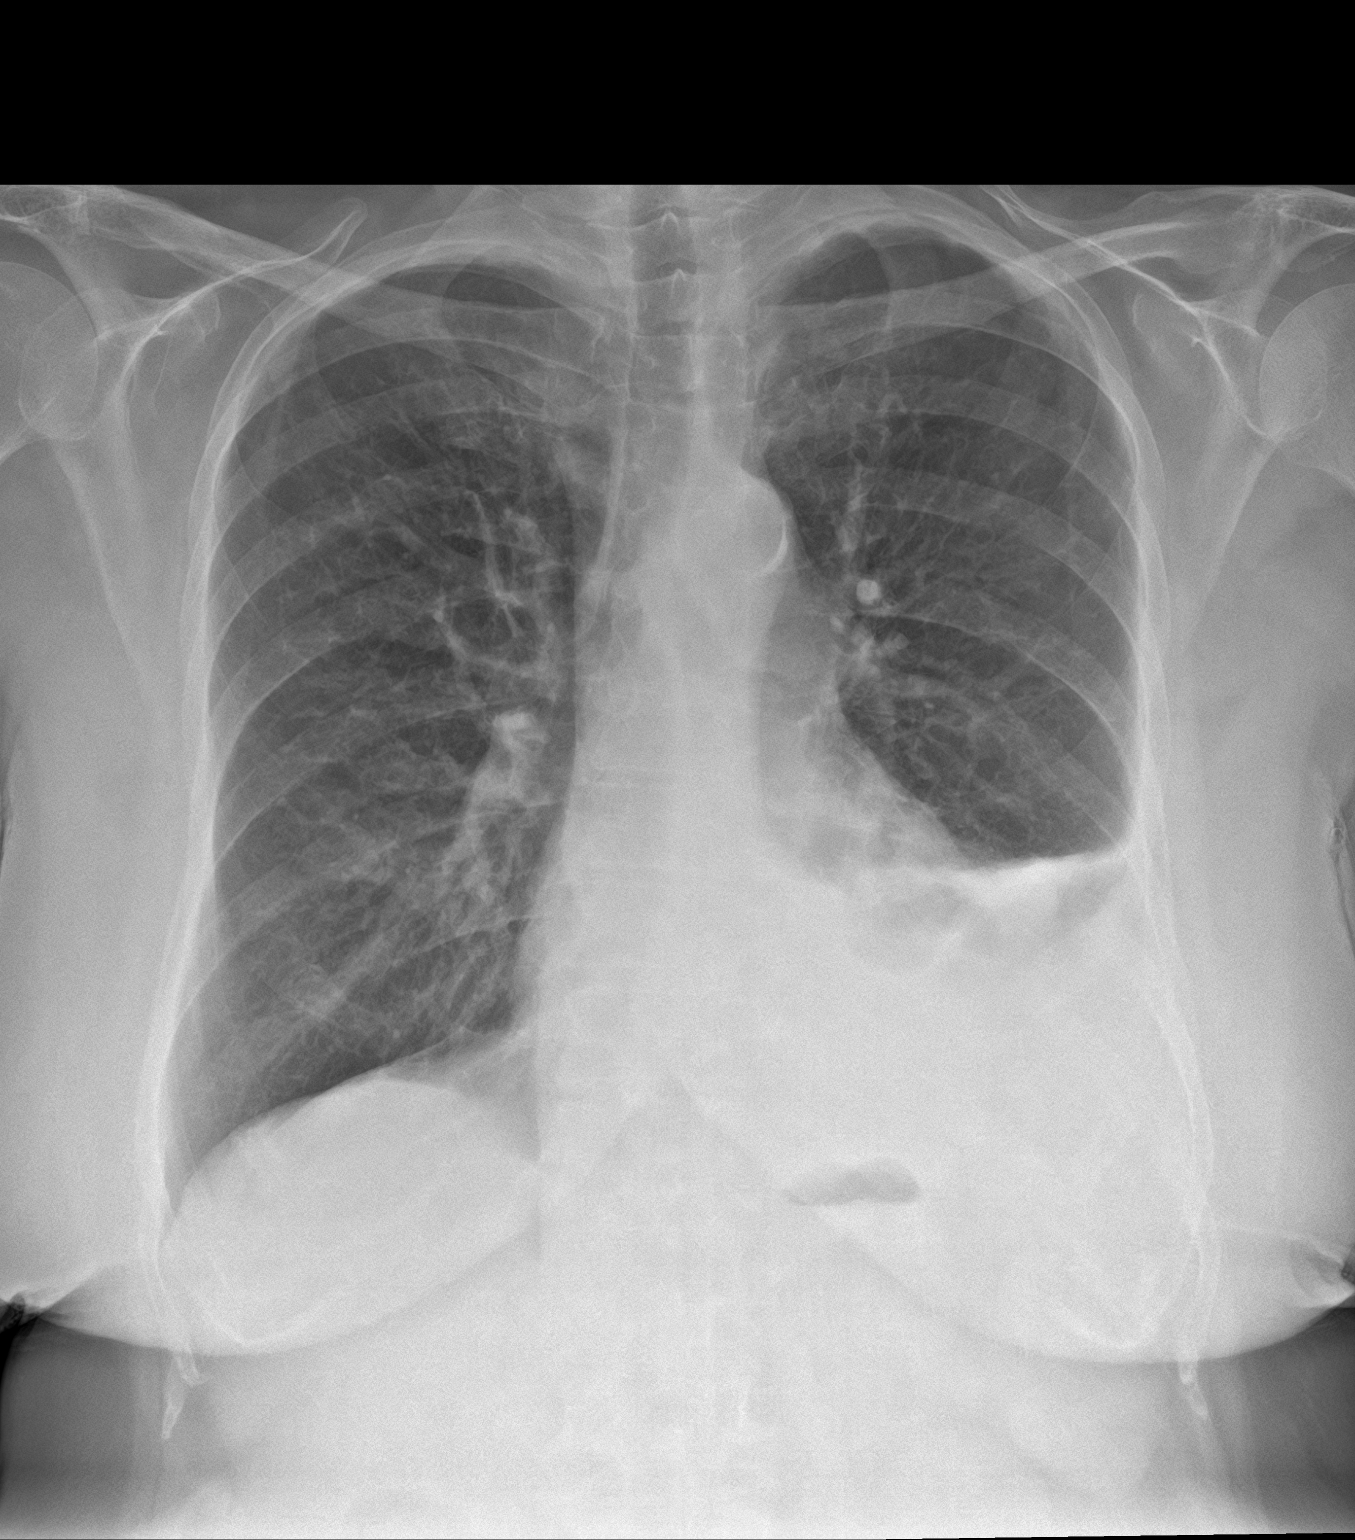

[chest lat]
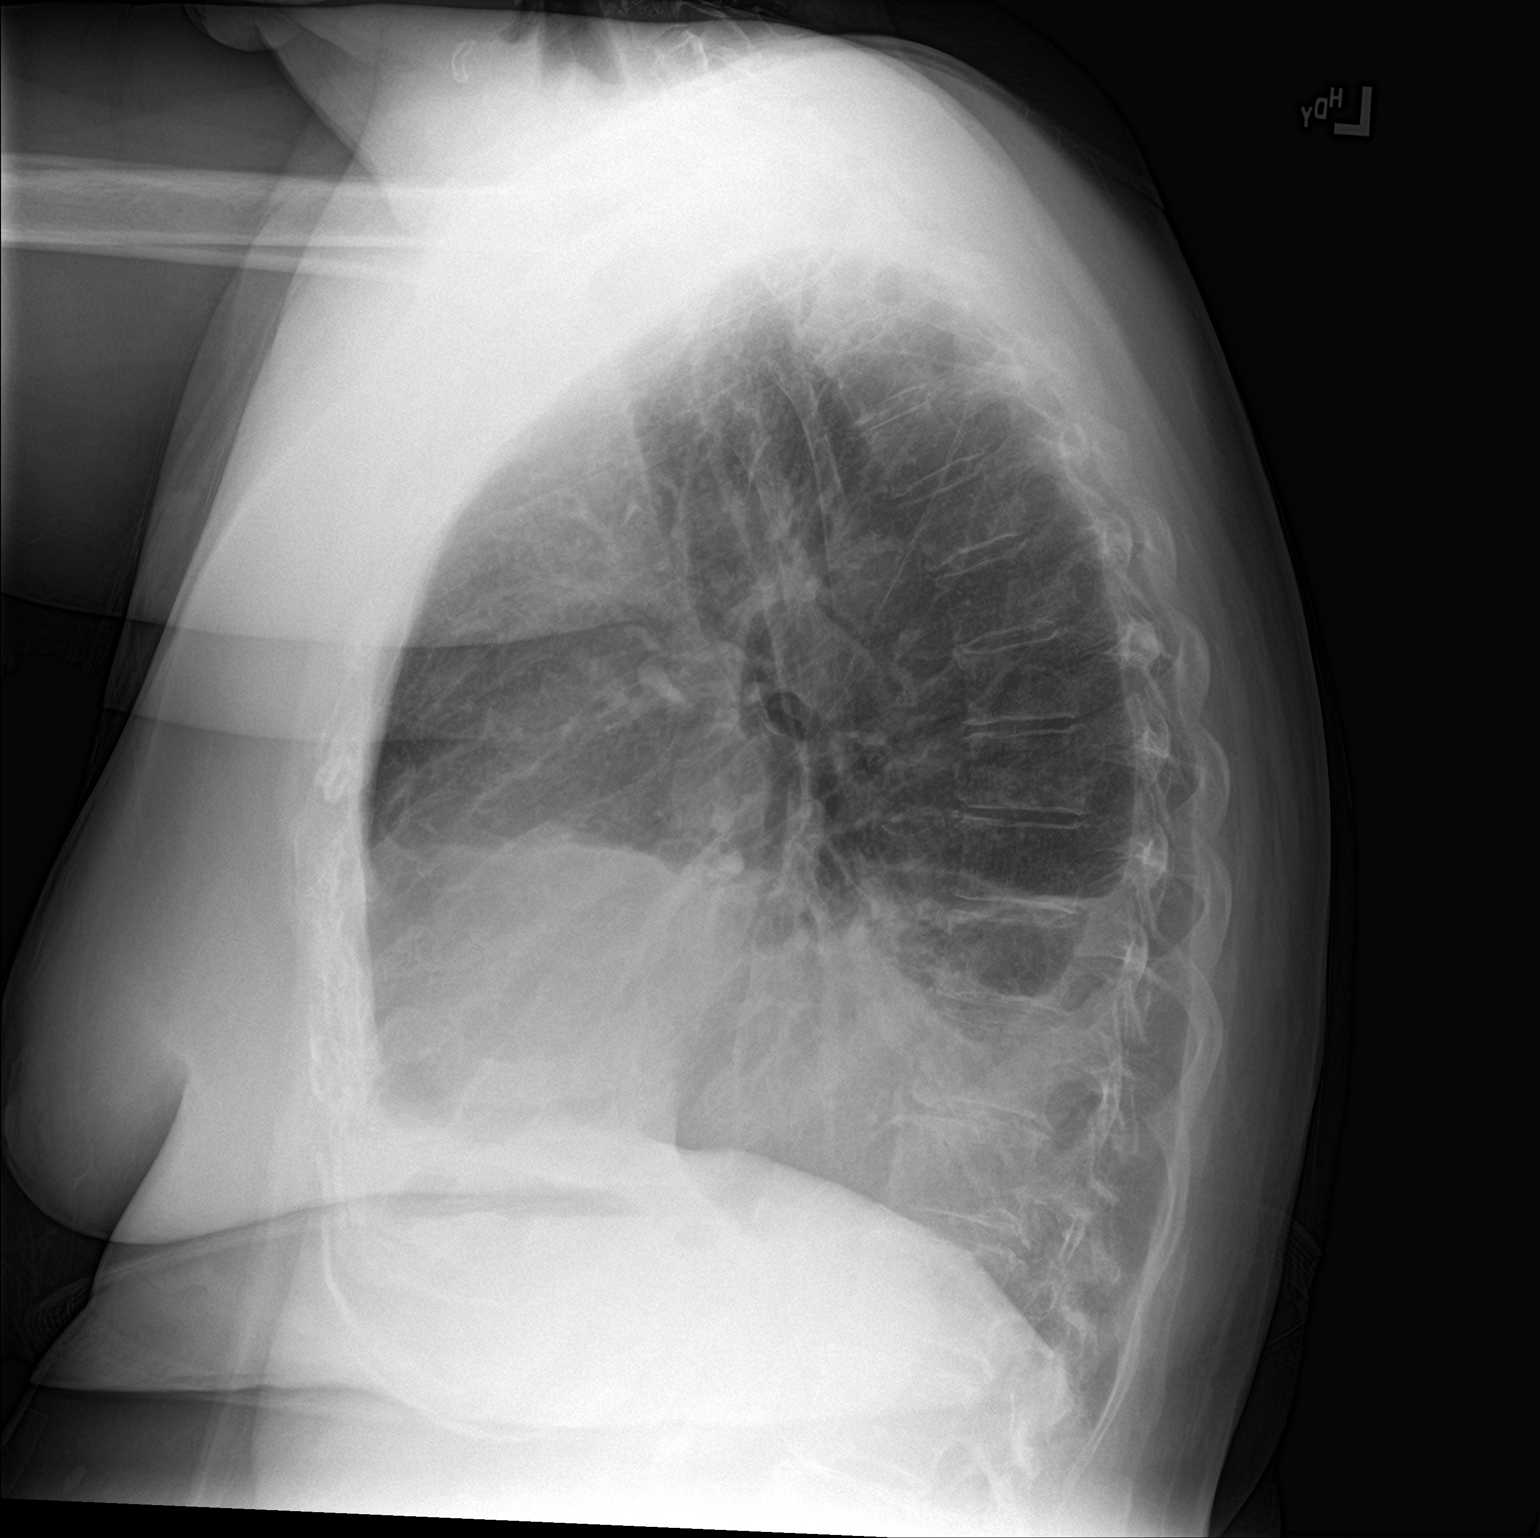

[2 of 2 positions shown; findings below may reference images not displayed]

FINDINGS: Normal cardiac silhouette. There is reaccumulation of the LEFT
unilocular pleural effusion which occupies approximately a third of
the LEFT hemithorax volume. RIGHT lung clear.
IMPRESSION: Re-accumulation of large LEFT effusion.

## 2020-10-09 ENCOUNTER — Other Ambulatory Visit: Payer: Self-pay

## 2020-10-09 ENCOUNTER — Ambulatory Visit (INDEPENDENT_AMBULATORY_CARE_PROVIDER_SITE_OTHER): Payer: Medicare HMO

## 2020-10-09 DIAGNOSIS — I701 Atherosclerosis of renal artery: Secondary | ICD-10-CM

## 2020-10-13 IMAGING — DX PORTABLE CHEST - 1 VIEW
1 series · 2 of 2 positions shown · non-contrast
Comparison: 09/19/2018

CLINICAL DATA: Post left from cos copy.

EXAM:
PORTABLE CHEST 1 VIEW

[Series 1: chest ap · 0.14mm/px · 2 of 2 slices shown]
[im 1/2]
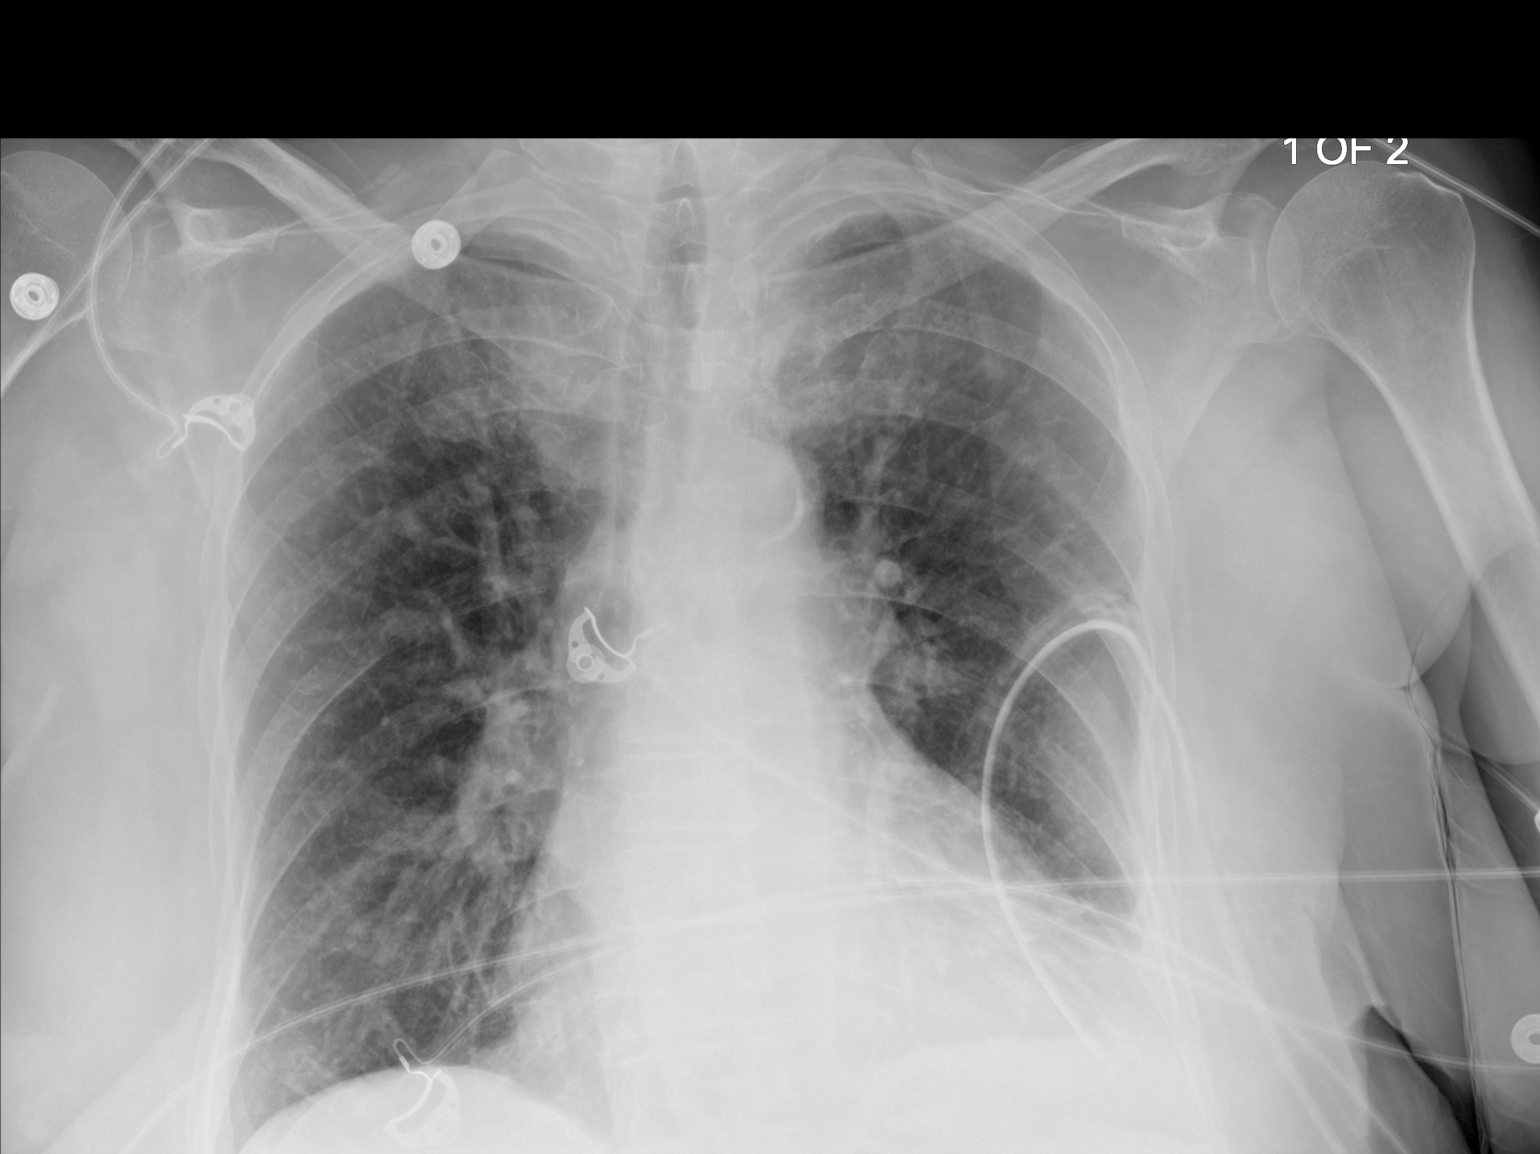
[im 2/2]
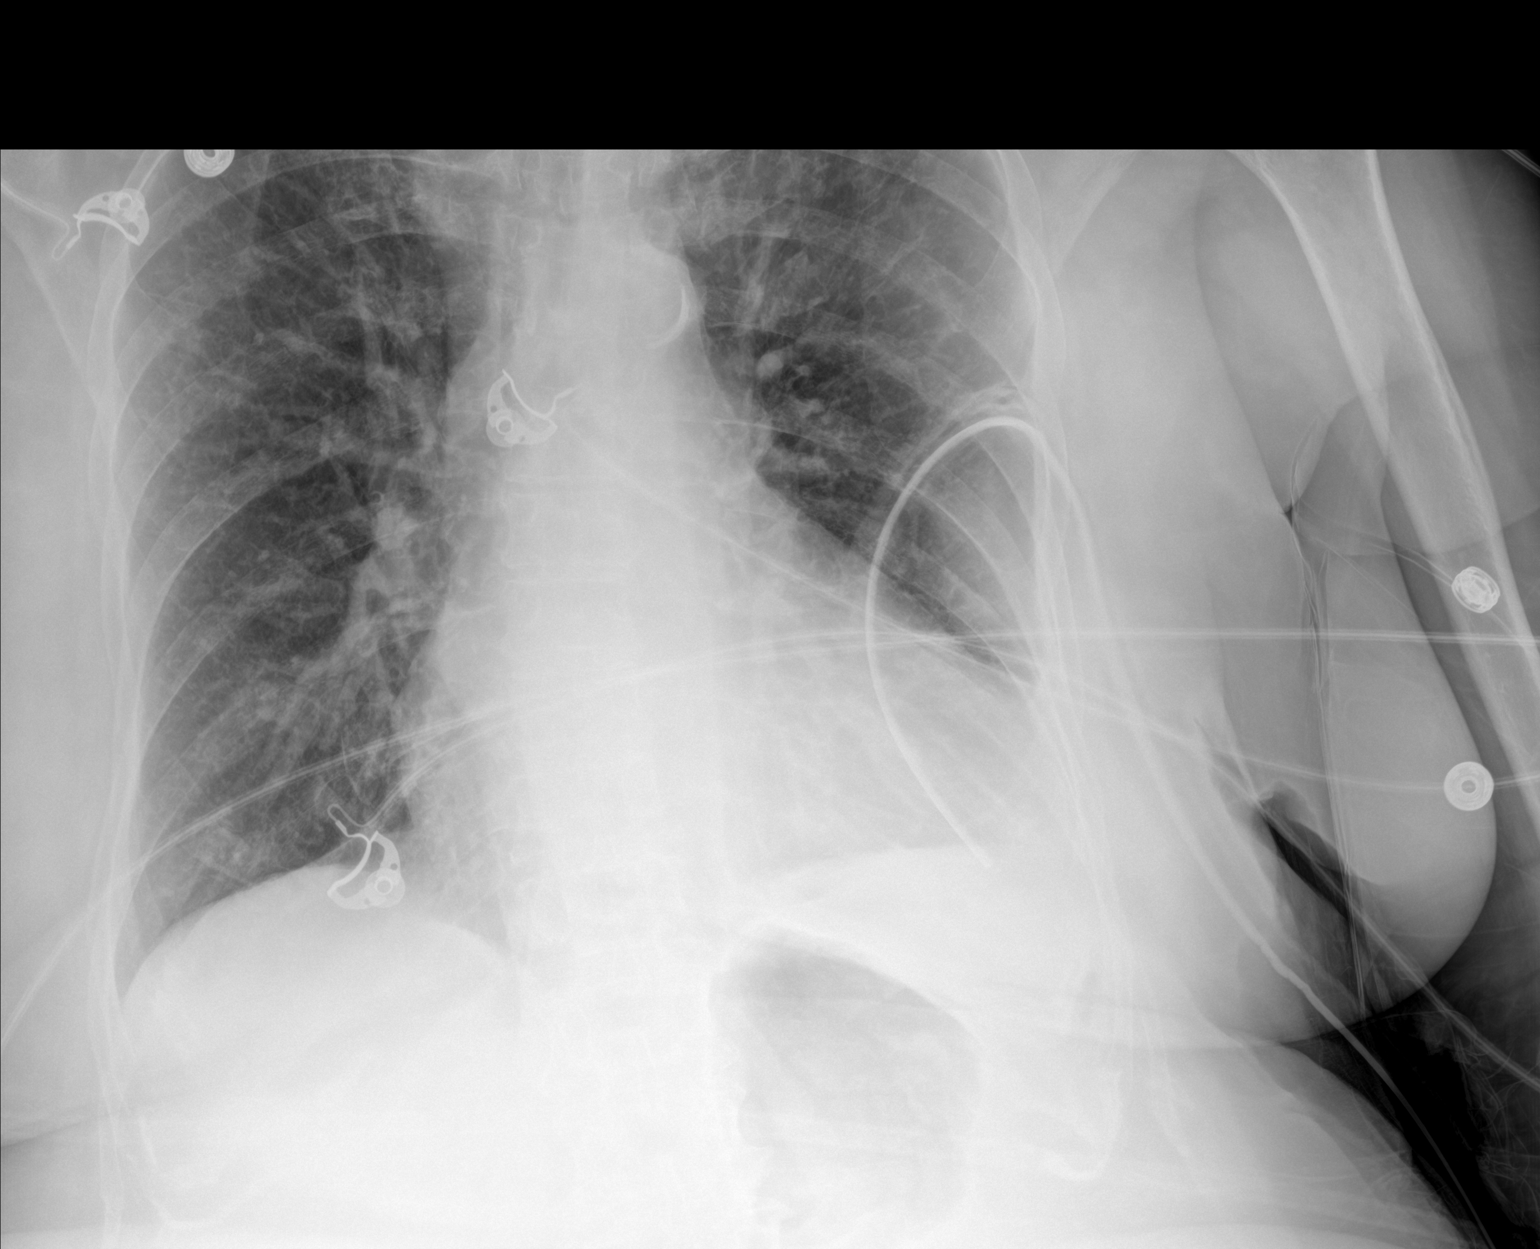

[2 of 2 positions shown; findings below may reference images not displayed]

FINDINGS: Left chest tube in place.

Cardiomediastinal silhouette is normal. Mediastinal contours appear
intact. Calcific atherosclerotic disease of the aorta.

There is no evidence pneumothorax. Small left pleural effusion.

Osseous structures are without acute abnormality. Soft tissues are
grossly normal.
IMPRESSION: 1. Small left pleural effusion. No radiographic evidence of
pneumothorax.
2. Calcific atherosclerotic disease of the aorta.

## 2021-10-21 DIAGNOSIS — H353132 Nonexudative age-related macular degeneration, bilateral, intermediate dry stage: Secondary | ICD-10-CM | POA: Diagnosis not present

## 2021-12-25 DIAGNOSIS — Z79899 Other long term (current) drug therapy: Secondary | ICD-10-CM | POA: Diagnosis not present

## 2021-12-25 DIAGNOSIS — G5793 Unspecified mononeuropathy of bilateral lower limbs: Secondary | ICD-10-CM | POA: Diagnosis not present

## 2021-12-25 DIAGNOSIS — M1A00X0 Idiopathic chronic gout, unspecified site, without tophus (tophi): Secondary | ICD-10-CM | POA: Diagnosis not present

## 2021-12-25 DIAGNOSIS — M0600 Rheumatoid arthritis without rheumatoid factor, unspecified site: Secondary | ICD-10-CM | POA: Diagnosis not present

## 2021-12-25 DIAGNOSIS — N1831 Chronic kidney disease, stage 3a: Secondary | ICD-10-CM | POA: Diagnosis not present

## 2022-01-02 DIAGNOSIS — R946 Abnormal results of thyroid function studies: Secondary | ICD-10-CM | POA: Diagnosis not present

## 2022-01-27 DIAGNOSIS — N2581 Secondary hyperparathyroidism of renal origin: Secondary | ICD-10-CM | POA: Diagnosis not present

## 2022-01-27 DIAGNOSIS — I10 Essential (primary) hypertension: Secondary | ICD-10-CM | POA: Diagnosis not present

## 2022-01-27 DIAGNOSIS — N184 Chronic kidney disease, stage 4 (severe): Secondary | ICD-10-CM | POA: Diagnosis not present

## 2022-01-27 DIAGNOSIS — D631 Anemia in chronic kidney disease: Secondary | ICD-10-CM | POA: Diagnosis not present

## 2022-03-26 DIAGNOSIS — E785 Hyperlipidemia, unspecified: Secondary | ICD-10-CM | POA: Diagnosis not present

## 2022-03-26 DIAGNOSIS — N184 Chronic kidney disease, stage 4 (severe): Secondary | ICD-10-CM | POA: Diagnosis not present

## 2022-03-26 DIAGNOSIS — N2581 Secondary hyperparathyroidism of renal origin: Secondary | ICD-10-CM | POA: Diagnosis not present

## 2022-03-26 DIAGNOSIS — Z79899 Other long term (current) drug therapy: Secondary | ICD-10-CM | POA: Diagnosis not present

## 2022-03-26 DIAGNOSIS — Z1389 Encounter for screening for other disorder: Secondary | ICD-10-CM | POA: Diagnosis not present

## 2022-03-26 DIAGNOSIS — M1A00X0 Idiopathic chronic gout, unspecified site, without tophus (tophi): Secondary | ICD-10-CM | POA: Diagnosis not present

## 2022-03-26 DIAGNOSIS — Z0000 Encounter for general adult medical examination without abnormal findings: Secondary | ICD-10-CM | POA: Diagnosis not present

## 2022-03-26 DIAGNOSIS — I129 Hypertensive chronic kidney disease with stage 1 through stage 4 chronic kidney disease, or unspecified chronic kidney disease: Secondary | ICD-10-CM | POA: Diagnosis not present

## 2022-03-26 DIAGNOSIS — I2510 Atherosclerotic heart disease of native coronary artery without angina pectoris: Secondary | ICD-10-CM | POA: Diagnosis not present

## 2022-04-20 DIAGNOSIS — Z79899 Other long term (current) drug therapy: Secondary | ICD-10-CM | POA: Diagnosis not present

## 2022-05-06 DIAGNOSIS — M170 Bilateral primary osteoarthritis of knee: Secondary | ICD-10-CM | POA: Diagnosis not present

## 2022-05-26 DIAGNOSIS — N1831 Chronic kidney disease, stage 3a: Secondary | ICD-10-CM | POA: Diagnosis not present

## 2022-05-26 DIAGNOSIS — M0600 Rheumatoid arthritis without rheumatoid factor, unspecified site: Secondary | ICD-10-CM | POA: Diagnosis not present

## 2022-05-26 DIAGNOSIS — G5793 Unspecified mononeuropathy of bilateral lower limbs: Secondary | ICD-10-CM | POA: Diagnosis not present

## 2022-05-26 DIAGNOSIS — Z79899 Other long term (current) drug therapy: Secondary | ICD-10-CM | POA: Diagnosis not present

## 2022-05-26 DIAGNOSIS — M1A00X0 Idiopathic chronic gout, unspecified site, without tophus (tophi): Secondary | ICD-10-CM | POA: Diagnosis not present

## 2022-06-01 DIAGNOSIS — N184 Chronic kidney disease, stage 4 (severe): Secondary | ICD-10-CM | POA: Diagnosis not present

## 2022-06-01 DIAGNOSIS — N2581 Secondary hyperparathyroidism of renal origin: Secondary | ICD-10-CM | POA: Diagnosis not present

## 2022-06-01 DIAGNOSIS — D631 Anemia in chronic kidney disease: Secondary | ICD-10-CM | POA: Diagnosis not present

## 2022-06-01 DIAGNOSIS — I10 Essential (primary) hypertension: Secondary | ICD-10-CM | POA: Diagnosis not present

## 2022-08-18 DIAGNOSIS — N390 Urinary tract infection, site not specified: Secondary | ICD-10-CM | POA: Diagnosis not present

## 2022-08-18 DIAGNOSIS — R300 Dysuria: Secondary | ICD-10-CM | POA: Diagnosis not present

## 2022-08-18 DIAGNOSIS — N183 Chronic kidney disease, stage 3 unspecified: Secondary | ICD-10-CM | POA: Diagnosis not present

## 2022-08-18 DIAGNOSIS — R319 Hematuria, unspecified: Secondary | ICD-10-CM | POA: Diagnosis not present

## 2022-09-24 DIAGNOSIS — N2581 Secondary hyperparathyroidism of renal origin: Secondary | ICD-10-CM | POA: Diagnosis not present

## 2022-09-24 DIAGNOSIS — I129 Hypertensive chronic kidney disease with stage 1 through stage 4 chronic kidney disease, or unspecified chronic kidney disease: Secondary | ICD-10-CM | POA: Diagnosis not present

## 2022-09-24 DIAGNOSIS — N184 Chronic kidney disease, stage 4 (severe): Secondary | ICD-10-CM | POA: Diagnosis not present

## 2022-09-24 DIAGNOSIS — R946 Abnormal results of thyroid function studies: Secondary | ICD-10-CM | POA: Diagnosis not present

## 2022-09-24 DIAGNOSIS — E7800 Pure hypercholesterolemia, unspecified: Secondary | ICD-10-CM | POA: Diagnosis not present

## 2022-09-24 DIAGNOSIS — Z6841 Body Mass Index (BMI) 40.0 and over, adult: Secondary | ICD-10-CM | POA: Diagnosis not present

## 2022-09-24 DIAGNOSIS — R7302 Impaired glucose tolerance (oral): Secondary | ICD-10-CM | POA: Diagnosis not present

## 2022-09-24 DIAGNOSIS — Z79899 Other long term (current) drug therapy: Secondary | ICD-10-CM | POA: Diagnosis not present

## 2022-09-24 DIAGNOSIS — I2510 Atherosclerotic heart disease of native coronary artery without angina pectoris: Secondary | ICD-10-CM | POA: Diagnosis not present

## 2022-09-24 DIAGNOSIS — M0600 Rheumatoid arthritis without rheumatoid factor, unspecified site: Secondary | ICD-10-CM | POA: Diagnosis not present

## 2022-09-24 DIAGNOSIS — M1A00X0 Idiopathic chronic gout, unspecified site, without tophus (tophi): Secondary | ICD-10-CM | POA: Diagnosis not present

## 2022-09-25 DIAGNOSIS — E7800 Pure hypercholesterolemia, unspecified: Secondary | ICD-10-CM | POA: Diagnosis not present

## 2022-09-25 DIAGNOSIS — I2510 Atherosclerotic heart disease of native coronary artery without angina pectoris: Secondary | ICD-10-CM | POA: Diagnosis not present

## 2022-09-25 DIAGNOSIS — R946 Abnormal results of thyroid function studies: Secondary | ICD-10-CM | POA: Diagnosis not present

## 2022-09-25 DIAGNOSIS — G5793 Unspecified mononeuropathy of bilateral lower limbs: Secondary | ICD-10-CM | POA: Diagnosis not present

## 2022-09-25 DIAGNOSIS — M1A00X0 Idiopathic chronic gout, unspecified site, without tophus (tophi): Secondary | ICD-10-CM | POA: Diagnosis not present

## 2022-09-25 DIAGNOSIS — R7302 Impaired glucose tolerance (oral): Secondary | ICD-10-CM | POA: Diagnosis not present

## 2022-09-25 DIAGNOSIS — Z79899 Other long term (current) drug therapy: Secondary | ICD-10-CM | POA: Diagnosis not present

## 2022-09-25 DIAGNOSIS — M0600 Rheumatoid arthritis without rheumatoid factor, unspecified site: Secondary | ICD-10-CM | POA: Diagnosis not present

## 2022-10-05 DIAGNOSIS — N1832 Chronic kidney disease, stage 3b: Secondary | ICD-10-CM | POA: Diagnosis not present

## 2022-10-05 DIAGNOSIS — D631 Anemia in chronic kidney disease: Secondary | ICD-10-CM | POA: Diagnosis not present

## 2022-10-05 DIAGNOSIS — N2581 Secondary hyperparathyroidism of renal origin: Secondary | ICD-10-CM | POA: Diagnosis not present

## 2022-10-05 DIAGNOSIS — R809 Proteinuria, unspecified: Secondary | ICD-10-CM | POA: Diagnosis not present

## 2022-10-05 DIAGNOSIS — I10 Essential (primary) hypertension: Secondary | ICD-10-CM | POA: Diagnosis not present

## 2022-10-19 DIAGNOSIS — Z79899 Other long term (current) drug therapy: Secondary | ICD-10-CM | POA: Diagnosis not present

## 2022-10-26 DIAGNOSIS — Z961 Presence of intraocular lens: Secondary | ICD-10-CM | POA: Diagnosis not present

## 2022-10-26 DIAGNOSIS — H353132 Nonexudative age-related macular degeneration, bilateral, intermediate dry stage: Secondary | ICD-10-CM | POA: Diagnosis not present

## 2022-10-26 DIAGNOSIS — Z79899 Other long term (current) drug therapy: Secondary | ICD-10-CM | POA: Diagnosis not present

## 2022-10-26 DIAGNOSIS — M069 Rheumatoid arthritis, unspecified: Secondary | ICD-10-CM | POA: Diagnosis not present

## 2022-12-23 DIAGNOSIS — L853 Xerosis cutis: Secondary | ICD-10-CM | POA: Diagnosis not present

## 2022-12-23 DIAGNOSIS — L282 Other prurigo: Secondary | ICD-10-CM | POA: Diagnosis not present

## 2023-03-17 DIAGNOSIS — M0600 Rheumatoid arthritis without rheumatoid factor, unspecified site: Secondary | ICD-10-CM | POA: Diagnosis not present

## 2023-03-17 DIAGNOSIS — M1A00X0 Idiopathic chronic gout, unspecified site, without tophus (tophi): Secondary | ICD-10-CM | POA: Diagnosis not present

## 2023-03-17 DIAGNOSIS — Z79899 Other long term (current) drug therapy: Secondary | ICD-10-CM | POA: Diagnosis not present

## 2023-03-31 DIAGNOSIS — E7800 Pure hypercholesterolemia, unspecified: Secondary | ICD-10-CM | POA: Diagnosis not present

## 2023-03-31 DIAGNOSIS — R21 Rash and other nonspecific skin eruption: Secondary | ICD-10-CM | POA: Diagnosis not present

## 2023-03-31 DIAGNOSIS — N1832 Chronic kidney disease, stage 3b: Secondary | ICD-10-CM | POA: Diagnosis not present

## 2023-03-31 DIAGNOSIS — I129 Hypertensive chronic kidney disease with stage 1 through stage 4 chronic kidney disease, or unspecified chronic kidney disease: Secondary | ICD-10-CM | POA: Diagnosis not present

## 2023-03-31 DIAGNOSIS — Z79899 Other long term (current) drug therapy: Secondary | ICD-10-CM | POA: Diagnosis not present

## 2023-03-31 DIAGNOSIS — R946 Abnormal results of thyroid function studies: Secondary | ICD-10-CM | POA: Diagnosis not present

## 2023-03-31 DIAGNOSIS — M1A00X0 Idiopathic chronic gout, unspecified site, without tophus (tophi): Secondary | ICD-10-CM | POA: Diagnosis not present

## 2023-03-31 DIAGNOSIS — I2510 Atherosclerotic heart disease of native coronary artery without angina pectoris: Secondary | ICD-10-CM | POA: Diagnosis not present

## 2023-03-31 DIAGNOSIS — N2581 Secondary hyperparathyroidism of renal origin: Secondary | ICD-10-CM | POA: Diagnosis not present

## 2023-03-31 DIAGNOSIS — Z0000 Encounter for general adult medical examination without abnormal findings: Secondary | ICD-10-CM | POA: Diagnosis not present

## 2023-03-31 DIAGNOSIS — Z1331 Encounter for screening for depression: Secondary | ICD-10-CM | POA: Diagnosis not present

## 2023-04-27 DIAGNOSIS — Z961 Presence of intraocular lens: Secondary | ICD-10-CM | POA: Diagnosis not present

## 2023-04-27 DIAGNOSIS — H353132 Nonexudative age-related macular degeneration, bilateral, intermediate dry stage: Secondary | ICD-10-CM | POA: Diagnosis not present

## 2023-04-27 DIAGNOSIS — H0100A Unspecified blepharitis right eye, upper and lower eyelids: Secondary | ICD-10-CM | POA: Diagnosis not present

## 2023-05-10 DIAGNOSIS — Z23 Encounter for immunization: Secondary | ICD-10-CM | POA: Diagnosis not present

## 2023-07-22 DIAGNOSIS — G5793 Unspecified mononeuropathy of bilateral lower limbs: Secondary | ICD-10-CM | POA: Diagnosis not present

## 2023-07-22 DIAGNOSIS — Z6841 Body Mass Index (BMI) 40.0 and over, adult: Secondary | ICD-10-CM | POA: Diagnosis not present

## 2023-07-22 DIAGNOSIS — M1A00X0 Idiopathic chronic gout, unspecified site, without tophus (tophi): Secondary | ICD-10-CM | POA: Diagnosis not present

## 2023-07-22 DIAGNOSIS — Z79899 Other long term (current) drug therapy: Secondary | ICD-10-CM | POA: Diagnosis not present

## 2023-07-22 DIAGNOSIS — M0600 Rheumatoid arthritis without rheumatoid factor, unspecified site: Secondary | ICD-10-CM | POA: Diagnosis not present

## 2023-08-10 DIAGNOSIS — N2581 Secondary hyperparathyroidism of renal origin: Secondary | ICD-10-CM | POA: Diagnosis not present

## 2023-08-10 DIAGNOSIS — R809 Proteinuria, unspecified: Secondary | ICD-10-CM | POA: Diagnosis not present

## 2023-08-10 DIAGNOSIS — I10 Essential (primary) hypertension: Secondary | ICD-10-CM | POA: Diagnosis not present

## 2023-08-10 DIAGNOSIS — N1832 Chronic kidney disease, stage 3b: Secondary | ICD-10-CM | POA: Diagnosis not present

## 2023-09-28 DIAGNOSIS — I129 Hypertensive chronic kidney disease with stage 1 through stage 4 chronic kidney disease, or unspecified chronic kidney disease: Secondary | ICD-10-CM | POA: Diagnosis not present

## 2023-09-28 DIAGNOSIS — N1832 Chronic kidney disease, stage 3b: Secondary | ICD-10-CM | POA: Diagnosis not present

## 2023-09-28 DIAGNOSIS — I2510 Atherosclerotic heart disease of native coronary artery without angina pectoris: Secondary | ICD-10-CM | POA: Diagnosis not present

## 2023-09-28 DIAGNOSIS — N2581 Secondary hyperparathyroidism of renal origin: Secondary | ICD-10-CM | POA: Diagnosis not present

## 2023-09-28 DIAGNOSIS — Z79899 Other long term (current) drug therapy: Secondary | ICD-10-CM | POA: Diagnosis not present

## 2023-09-28 DIAGNOSIS — R7302 Impaired glucose tolerance (oral): Secondary | ICD-10-CM | POA: Diagnosis not present

## 2023-09-28 DIAGNOSIS — M06061 Rheumatoid arthritis without rheumatoid factor, right knee: Secondary | ICD-10-CM | POA: Diagnosis not present

## 2023-09-28 DIAGNOSIS — E785 Hyperlipidemia, unspecified: Secondary | ICD-10-CM | POA: Diagnosis not present

## 2023-09-28 DIAGNOSIS — R946 Abnormal results of thyroid function studies: Secondary | ICD-10-CM | POA: Diagnosis not present

## 2023-10-26 DIAGNOSIS — M069 Rheumatoid arthritis, unspecified: Secondary | ICD-10-CM | POA: Diagnosis not present

## 2023-10-26 DIAGNOSIS — H353132 Nonexudative age-related macular degeneration, bilateral, intermediate dry stage: Secondary | ICD-10-CM | POA: Diagnosis not present

## 2023-10-26 DIAGNOSIS — Z79899 Other long term (current) drug therapy: Secondary | ICD-10-CM | POA: Diagnosis not present

## 2023-10-26 DIAGNOSIS — H26492 Other secondary cataract, left eye: Secondary | ICD-10-CM | POA: Diagnosis not present

## 2023-12-14 DIAGNOSIS — I10 Essential (primary) hypertension: Secondary | ICD-10-CM | POA: Diagnosis not present

## 2023-12-14 DIAGNOSIS — D631 Anemia in chronic kidney disease: Secondary | ICD-10-CM | POA: Diagnosis not present

## 2023-12-14 DIAGNOSIS — R300 Dysuria: Secondary | ICD-10-CM | POA: Diagnosis not present

## 2023-12-14 DIAGNOSIS — N1832 Chronic kidney disease, stage 3b: Secondary | ICD-10-CM | POA: Diagnosis not present

## 2023-12-14 DIAGNOSIS — N2581 Secondary hyperparathyroidism of renal origin: Secondary | ICD-10-CM | POA: Diagnosis not present

## 2023-12-14 DIAGNOSIS — R809 Proteinuria, unspecified: Secondary | ICD-10-CM | POA: Diagnosis not present

## 2024-01-11 DIAGNOSIS — Z79899 Other long term (current) drug therapy: Secondary | ICD-10-CM | POA: Diagnosis not present

## 2024-01-11 DIAGNOSIS — M0600 Rheumatoid arthritis without rheumatoid factor, unspecified site: Secondary | ICD-10-CM | POA: Diagnosis not present

## 2024-01-11 DIAGNOSIS — G5793 Unspecified mononeuropathy of bilateral lower limbs: Secondary | ICD-10-CM | POA: Diagnosis not present

## 2024-01-11 DIAGNOSIS — M1A9XX0 Chronic gout, unspecified, without tophus (tophi): Secondary | ICD-10-CM | POA: Diagnosis not present

## 2024-04-03 DIAGNOSIS — Z0000 Encounter for general adult medical examination without abnormal findings: Secondary | ICD-10-CM | POA: Diagnosis not present

## 2024-04-03 DIAGNOSIS — E7800 Pure hypercholesterolemia, unspecified: Secondary | ICD-10-CM | POA: Diagnosis not present

## 2024-04-03 DIAGNOSIS — N1832 Chronic kidney disease, stage 3b: Secondary | ICD-10-CM | POA: Diagnosis not present

## 2024-04-03 DIAGNOSIS — I129 Hypertensive chronic kidney disease with stage 1 through stage 4 chronic kidney disease, or unspecified chronic kidney disease: Secondary | ICD-10-CM | POA: Diagnosis not present

## 2024-04-03 DIAGNOSIS — R946 Abnormal results of thyroid function studies: Secondary | ICD-10-CM | POA: Diagnosis not present

## 2024-04-03 DIAGNOSIS — N2581 Secondary hyperparathyroidism of renal origin: Secondary | ICD-10-CM | POA: Diagnosis not present

## 2024-04-03 DIAGNOSIS — M1A9XX0 Chronic gout, unspecified, without tophus (tophi): Secondary | ICD-10-CM | POA: Diagnosis not present

## 2024-04-03 DIAGNOSIS — Z1331 Encounter for screening for depression: Secondary | ICD-10-CM | POA: Diagnosis not present

## 2024-04-03 DIAGNOSIS — M069 Rheumatoid arthritis, unspecified: Secondary | ICD-10-CM | POA: Diagnosis not present

## 2024-04-03 DIAGNOSIS — Z23 Encounter for immunization: Secondary | ICD-10-CM | POA: Diagnosis not present

## 2024-04-17 DIAGNOSIS — D631 Anemia in chronic kidney disease: Secondary | ICD-10-CM | POA: Diagnosis not present

## 2024-04-17 DIAGNOSIS — I10 Essential (primary) hypertension: Secondary | ICD-10-CM | POA: Diagnosis not present

## 2024-04-17 DIAGNOSIS — R809 Proteinuria, unspecified: Secondary | ICD-10-CM | POA: Diagnosis not present

## 2024-04-17 DIAGNOSIS — N1832 Chronic kidney disease, stage 3b: Secondary | ICD-10-CM | POA: Diagnosis not present

## 2024-04-17 DIAGNOSIS — N2581 Secondary hyperparathyroidism of renal origin: Secondary | ICD-10-CM | POA: Diagnosis not present

## 2024-04-21 DIAGNOSIS — H26492 Other secondary cataract, left eye: Secondary | ICD-10-CM | POA: Diagnosis not present

## 2024-04-21 DIAGNOSIS — H353132 Nonexudative age-related macular degeneration, bilateral, intermediate dry stage: Secondary | ICD-10-CM | POA: Diagnosis not present

## 2024-04-21 DIAGNOSIS — Z961 Presence of intraocular lens: Secondary | ICD-10-CM | POA: Diagnosis not present

## 2024-04-21 DIAGNOSIS — Z79899 Other long term (current) drug therapy: Secondary | ICD-10-CM | POA: Diagnosis not present

## 2024-05-12 DIAGNOSIS — G5793 Unspecified mononeuropathy of bilateral lower limbs: Secondary | ICD-10-CM | POA: Diagnosis not present

## 2024-05-12 DIAGNOSIS — M1A9XX0 Chronic gout, unspecified, without tophus (tophi): Secondary | ICD-10-CM | POA: Diagnosis not present

## 2024-05-12 DIAGNOSIS — Z79899 Other long term (current) drug therapy: Secondary | ICD-10-CM | POA: Diagnosis not present

## 2024-05-12 DIAGNOSIS — M0600 Rheumatoid arthritis without rheumatoid factor, unspecified site: Secondary | ICD-10-CM | POA: Diagnosis not present

## 2024-05-12 DIAGNOSIS — M65341 Trigger finger, right ring finger: Secondary | ICD-10-CM | POA: Diagnosis not present

## 2024-05-12 DIAGNOSIS — M65331 Trigger finger, right middle finger: Secondary | ICD-10-CM | POA: Diagnosis not present

## 2024-07-07 NOTE — Progress Notes (Addendum)
 Pamela Young                                          MRN: [id number]   07/07/2024   The VBCI Quality Team Specialist reviewed this patient medical record for the purposes of chart review for care gap closure. The following were reviewed: chart review for care gap closure-controlling blood pressure. Chart also reviewed for COL and BCS gaps.  08/08/2024- CBP OUT OF RANGE    VBCI Quality Team

## 2024-07-17 NOTE — Progress Notes (Addendum)
 ID Data:  Pamela Young, 73 y.o. female  CC:   Chief Complaint  Patient presents with   Hospital Follow Up    HPI:  Presents today for hospital visit follow-up, after being hospitalized at [facility] on 12/6-19/25 for intussusception of the transverse colon causing a large bowel obstruction. She was urgently taken to the OR and underwent an extended right hemicolectomy (subtotal colectomy from right colon to descending colon) with end ileostomy creation on 06/24/2024. Intraoperatively, there was an intussusception of transverse colon due to transverse colon mass, which caused the large bowel obstruction. She tolerated the procedure well.  She experienced new afib with RVR after surgery, Cardiology was consulted who deemed it likely due to pain after surgery and her ileus. She was started on metoprolol q6h with good effect and she went back into NSR days later. Echocardiogram showed no abnormalities. Metoprolol was transitioned to daily and she was started on therapeutic anticoagulation with Eliquis when she was tolerating PO. The patient will follow up with Cardiology outpatient (referral was sent).  A Zio Patch was ordered to be placed by ECG team for cardiac monitoring that Cardiology will follow up on.   Wound and ostomy nurses taught the patient and her daughter how to care for their ostomy and change their appliance, and she was able to demonstrate this appropriately. Surgical pathology returned as pT3pN1a, for which a referral will be center to the [facility]. CT chest for cancer staging was completed prior to discharge, which showed no signs of thoracic metastasis. She was evaluated by PT/OT, who recommended inpatient rehabilitation, but the patient and her family requested to pursue home health services. Home health with [facility] was coordinated by case management for nursing and therapies.  Follow Up Appts Scheduled: General surgery-07/19/24 Nephrology (routine)-08/22/24 Rheumatology  (routine)-09/12/24 Cardiology-10/04/24 PCP (routine)-10/02/24  Medication refills needed:  no PAUSE taking these medications furosemide  20 MG tablet STOP taking these medications  atenolol  25 MG tablet losartan  100 MG tablet triamterene -hydroCHLOROthiazide  75-50 mg per tablet START taking these medications  acetaminophen  325 MG tablet     Take 1-2 tablets (325 - 650 mg total) by mouth every 8 hours as needed for pain apixaban 5 mg Tab     Take 1 tablet (5 mg total) by mouth two (2) times a day. loperamide 2 mg capsule     Take 2 capsules (4 mg total) by mouth four (4) times a day. methocarbamol 500 MG tablet     Take 1 tablet (500 mg) by mouth every 8 hours as needed for muscle spasms metoPROLOL succinate 100 MG 24 hr tablet     Take 1 tablet (100 mg total) by mouth daily. pantoprazole 40 MG tablet     Take 1 tablet (40 mg total) by mouth daily. CHANGE how you take these medications  gabapentin 100 MG capsule     Take 3 capsules (300 mg total) by mouth Three (3) times a day CONTINUE taking these medications  amlodipine  10 MG tablet atorvastatin  40 MG tablet cholecalciferol  (vitamin D3-50 mcg (2,000 unit)) 50 mcg (2,000 unit) Cap  History of Present Illness She was recently hospitalized and diagnosed with colon cancer.  Needs referral to [facility] because referral from [facility] is out of network.  Since her discharge from the hospital, she feels 'pretty good' and is able to perform basic self-care activities such as washing, changing clothes, and getting drinks independently. Her daughter assists with her ostomy bag and meals. She uses a walker for mobility  and feels stable with it, although she attempts short distances without it. She is receiving home health services.  She feels congested and had a fever with chills last night, which improved after taking Tylenol . This morning, she developed a cough. Has not had Tylenol  this am.  No shortness of breath, headaches, dizziness, ear  pain, or chest pain. She notes that 'everybody in my realm has had the crud,' including her daughter who isolated herself for two days due to illness.  She has experienced some swelling in her legs and has been monitoring her blood pressure at home, with recent readings of 122/56 by home health yesterday and 140/60s in general when pt checks. Her ostomy is functioning well with soft stool output.  She is currently wearing a zio patch and know when to take off & send back to cardiology.   ROS: Gen:  Fever yes--subjective, Chills yes, Fatigue yes Head:  Headaches no, Dizziness  no  Ears:  Ear pain no Nose:  Nasal congestion yes Resp:  Cough yes, DOE  no, SOB no CV:  CP no, Palpitations/fluttering/rapid HR no, LE edema yes--a little, Checks BP at hm yes--not often, generally 140/60 when she checks. GI:  Nausea  no, Vomiting no, Decreased appetite yes, Abd pain no--just soreness Skin:  Incision looks good; gets stitches out tomorrow. Repro:  No LMP recorded. Patient is postmenopausal. Psych: PHQ 2/9 last 3 flowsheet values     09/28/2023 04/03/2024 07/18/2024  PHQ-9 Depression Screening   Little interest or pleasure in doing things 0 0 0  Feeling down, depressed, or hopeless 0 0 0    Current Medications: Outpatient Medications Prior to Visit  Medication Sig Dispense Refill   acetaminophen  (TYLENOL ) 500 MG tablet Take 500 mg by mouth every 6 (six) hours as needed     amLODIPine  (NORVASC ) 10 MG tablet Take 1 tablet by mouth once daily 90 tablet 1   ammonium lactate (AMLACTIN) 12 % cream APPLY  CREAM TOPICALLY TWICE DAILY (Patient taking differently: Apply topically 2 (two) times daily as needed) 385 g 1   apixaban (ELIQUIS) 5 mg tablet Take 5 mg by mouth 2 (two) times daily     atorvastatin  (LIPITOR) 40 MG tablet Take 1 tablet by mouth once daily 90 tablet 0   cholecalciferol  (VITAMIN D3) 2,000 unit capsule Take 2,000 Units by mouth once daily.     FUROsemide  (LASIX ) 20 MG tablet  Take 1 tablet by mouth once daily 90 tablet 1   gabapentin (NEURONTIN) 100 MG capsule Take 300 mg by mouth 3 (three) times daily     hydroxychloroquine  (PLAQUENIL ) 200 mg tablet TAKE 1 TABLET BY MOUTH  TWICE DAILY FOR RA 60 tablet 5   hydrOXYzine (ATARAX) 25 MG tablet TAKE 1 TABLET BY MOUTH THREE TIMES DAILY AS NEEDED FOR ITCHING 90 tablet 1   loperamide (IMODIUM) 2 mg capsule Take 4 mg by mouth every 6 (six) hours as needed     methocarbamoL (ROBAXIN) 500 MG tablet Take 500 mg by mouth every 8 (eight) hours as needed     metoprolol SUCCinate (TOPROL-XL) 100 MG XL tablet Take 100 mg by mouth once daily     pantoprazole (PROTONIX) 40 MG DR tablet Take 40 mg by mouth once daily     potassium chloride  (K-TAB ) 20 mEq TbER ER tablet Take 1 tablet by mouth once daily 90 tablet 1   sodium bicarbonate 650 MG tablet Take 650 mg by mouth 2 (two) times daily  vit A/vit C/vit E/zinc/copper (PRESERVISION AREDS ORAL) Take 2 capsules by mouth once daily     albuterol  90 mcg/actuation inhaler Inhale 2 inhalations into the lungs every 4 (four) hours as needed for Wheezing or Shortness of Breath for up to 90 days (Patient not taking: Reported on 07/18/2024) 18 g 1   allopurinoL  (ZYLOPRIM ) 100 MG tablet Take 2 tablets (200 mg total) by mouth once daily (Patient not taking: Reported on 07/18/2024) 180 tablet 1   atenoloL  (TENORMIN ) 25 MG tablet Take 1 tablet by mouth once daily (Patient not taking: Reported on 07/18/2024) 90 tablet 1   gabapentin (NEURONTIN) 300 MG capsule Take 1 capsule by mouth at bedtime (Patient not taking: Reported on 07/18/2024) 90 capsule 1   losartan  (COZAAR ) 100 MG tablet Take 1 tablet by mouth once daily (Patient not taking: Reported on 07/18/2024) 90 tablet 1   triamterene -hydroCHLOROthiazide  (MAXZIDE ) 75-50 mg tablet Take 1 tablet by mouth once daily (Patient not taking: Reported on 07/18/2024) 90 tablet 0   No facility-administered medications prior to visit.    Reconciled the current & D/C medications w/ pt by:  Oral review:  yes  Medication bottles:  no Above list may not be fully reflective of current meds due to inability to load updated list, but medication list was updated appropriately in the medication section of the chart.  Allergies: Allergies as of 07/18/2024 - Reviewed 07/18/2024  Allergen Reaction Noted   Codeine sulfate Unknown 05/02/2014   Leflunomide Itching 06/17/2020    Past Medical/Surgical History: Past Medical History:  Diagnosis Date   Anemia    Arthritis, midfoot    SEVERE   Bell's palsy 12/2018   Lt sided   Borderline abnormal TFTs    Borderline TSH/FT4, that have improved, but has high TPO antibodies.   Chronic gouty arthritis 12/22/2017   Chronic inflammatory arthritis 12/10/2017   CKD (chronic kidney disease) stage 3, GFR 30-59 ml/min (CMS-HCC)    Being followed by Dr. Marcelino   Colon cancer (CMS/HHS-HCC) 07/07/2024   Path pT3pN1a of transverse colon mass.  s/p extended right hemicolectomy (subtotal colectomy from right colon to descending colon) with end ileostomy creation.   Coronary artery disease involving native coronary artery of native heart 02/17/2017   By ct scan    Hyperlipidemia    Hypertension    Intussusception of intestine (CMS/HHS-HCC) 06/27/2024   Mitral valve disorder    Obesity (BMI 30-39.9), unspecified    Pleural effusion on right 08/2018   Recurrent pleural effusion on left 2019   Renal artery stenosis    Left, 60%.  Referred to vascular surgery by renal but pt declined (2021).   Secondary hyperparathyroidism (HHS-HCC) 2017   Followed by Dr. Marcelino   Seronegative rheumatoid arthritis (CMS/HHS-HCC) 03/07/2018   RF negative CCP elevated    Vitamin D  deficiency 2017   Vit D 15 (checked thru nephrology, who is supplementing w/ ergocalciferol ).   Past Surgical History:  Procedure Laterality Date   THORACENTESIS Left 08/11/2018   THORACENTESIS Left  08/30/2018   BRONCHOSCOPY  09/27/2018   w/ pleural bx (negative) due to recurrent pleural effusion w/o known etiology.  Lt thoracoscopy w/ talc  pleurodesis.   THORACENTESIS Right 10/20/2018   EXTRACTION CATARACT EXTRACAPSULAR W/INSERTION INTRAOCULAR PROSTHESIS Right 10/24/2019   EXTRACTION CATARACT EXTRACAPSULAR W/INSERTION INTRAOCULAR PROSTHESIS Left 11/23/2019   EXPLORATORY LAPAROTOMY  06/24/2024   Emergent x-lap for intussuception of transverse colon causing lg bowel obstruction.  Sx resultant from transverse colon mass, path +pT3pN1a.  s/p extended right  hemicolectomy (subtotal colectomy from right colon to descending colon) with end ileostomy creation.   TUBAL LIGATION Bilateral     Social History: Social History   Socioeconomic History   Marital status: Widowed    Spouse name: Elspeth   Number of children: 2  Occupational History   Occupation: Retired    Comment: Product manager  Tobacco Use   Smoking status: Former    Types: Cigarettes    Passive exposure: Past   Smokeless tobacco: Never  Vaping Use   Vaping status: Never Used  Substance and Sexual Activity   Alcohol use: No   Drug use: Never   Sexual activity: Not Currently    Partners: Male  Social History Narrative   Daughter:  Andrea   Social Drivers of Health   Financial Resource Strain: Low Risk  (04/03/2024)   Overall Financial Resource Strain (CARDIA)    Difficulty of Paying Living Expenses: Not hard at all  Food Insecurity: Food Insecurity Present (04/03/2024)   Hunger Vital Sign    Worried About Running Out of Food in the Last Year: Never true    Ran Out of Food in the Last Year: Sometimes true  Transportation Needs: No Transportation Needs (04/03/2024)   PRAPARE - Administrator, Civil Service (Medical): No    Lack of Transportation (Non-Medical): No  Housing Stability: Low Risk  (04/03/2024)   Housing Stability Vital Sign    Unable to Pay for Housing in the Last Year:  No    Number of Times Moved in the Last Year: 0    Homeless in the Last Year: No    Objective: BP (!) 150/56 (BP Location: Right upper arm, Patient Position: Sitting, BP Cuff Size: XL)   Pulse 100   Temp 37 C (98.6 F) (Oral)   Ht 166.6 cm (5' 5.6)   Wt (!) 120.2 kg (265 lb)   SpO2 97%   BMI 43.30 kg/m  BP Readings from Last 10 Encounters:  07/18/24 (!) 150/56  05/12/24 (!) 161/68  04/03/24 (!) 146/54  01/11/24 132/72  09/28/23 (!) 140/70  07/22/23 126/58  03/31/23 (!) 140/70  03/17/23 132/70  12/23/22 132/82  09/25/22 (!) 154/72   Wt Readings from Last 10 Encounters:  07/18/24 (!) 120.2 kg (265 lb)  05/12/24 (!) 120.7 kg (266 lb)  04/03/24 (!) 121.6 kg (268 lb)  01/11/24 (!) 124.7 kg (275 lb)  09/28/23 (!) 124.7 kg (275 lb)  07/22/23 (!) 123.8 kg (272 lb 14.9 oz)  03/31/23 (!) 123.8 kg (273 lb)  03/17/23 (!) 122.5 kg (270 lb)  12/23/22 (!) 124.6 kg (274 lb 12.8 oz)  09/25/22 (!) 124.7 kg (275 lb)    Gen:  WDWN female sitting in chair in NAD. Eyes:  Sclera clear.  Ears:  Bilat canals clear.  Bilat TM's pearly gray. Nose:  Mild congestion.  Maxillary & frontal sinuses non-tender. Mouth:  Mucous membranes moist.  Post-pharynx clear.  Neck:  No cervical lymphadenopathy. Resp:  Even & unlabored respirations.  Lungs CTA A&P. CV:  S1S2 RRR, no murmur, rub, gallop.  +Bilat LE edema, 2-3+ to at least mid-shin. Neuro:  A&O x3.  Mood & affect appropriate to the situation.  Cranial nerves II-XII grossly intact.  MS:  No skeletal deformities.  Moves all extremities equally. Skin:  Midline abd incision intact w/ staples; well approximated w/o erythema or drainage.  Ostomy bag present in the Rt upper midline area w/ soft brown stool noted in bag.   Assessment: Encounter  Diagnoses  Name Primary?   Hospital discharge follow-up Yes   Malignant neoplasm of colon, unspecified part of colon (CMS/HHS-HCC)    S/P laparoscopic surgery    History of creation of ostomy  (CMS/HHS-HCC)    Paroxysmal A-fib (CMS/HHS-HCC)    Essential hypertension    Stage 3b chronic kidney disease (CMS-HCC)    Encounter for long-term (current) use of medications    Depression screening    Influenza A     Assessment & Plan Malignant neoplasm of colon, status post laparoscopic resection with ostomy Status post laparoscopic resection with ostomy for colon cancer. Recovery is ongoing with some soreness at the incision site. Stitches are scheduled for removal tomorrow. Ostomy functioning well with no issues in stool passage. - Referral placed to [facility]. - Follow-up with surgeon for stitch removal tomorrow as scheduled.  Acute upper respiratory infection Symptoms include fever, chills, and cough. No colored nasal discharge or shortness of breath. Likely viral etiology given recent hospitalization and exposure to sick contacts. Differential includes influenza. - Swabbed for influenza to rule out flu. - If influenza is confirmed, will administer Tamiflu. - Given instructions for viral self care.  Paroxysmal atrial fibrillation Post-surgical episode of atrial fibrillation. Currently wearing a patch monitor to assess heart rhythm. - Continue wearing the zio monitor and follow instructions for removal and return. - Follow up with cardiologist in March.  Essential hypertension Blood pressure readings at home have been variable, with recent readings of 122/56 and 140/60s. Possible need for adjustment of antihypertensive regimen. - Restarted furosemide  (Lasix ) to manage fluid retention and assist with blood pressure control. - Monitor blood pressure at home and report any significant changes.  Recording duration: 16 minutes  Plan: Medications:  1.  Continue all meds as directed from hospital D/C & resume lasix  as noted above.   --New medication grid printed for pt to follow.  2.  Tamiflu sent in.  Labs/Referrals/Imaging:  1.   Orders Placed This Encounter   Procedures   Extended Respiratory Viral Panel - [facility]    Standing Status:   Future    Number of Occurrences:   1    Expected Date:   07/18/2024    Expiration Date:   11/16/2024    First COVID-19 test?:   No    Employed in healthcare setting?:   No    Symptomatic for COVID-19 as defined by CDC?:   Yes    Hospitalized for COVID-19?:   No    Admitted to ICU for COVID-19?:   No    Resident in a congregate (group) care setting?:   No    Pregnant?:   No    Release to patient:   Immediate   Ambulatory Referral to Oncology-Medical    Referral Priority:   Routine    Referral Type:   Consultation    Requested Specialty:   Oncology    Number of Visits Requested:   1   Depression Screen -(PHQ- 2/9, BDI)   2.  Continue to flu w/ all specialty providers as directed.  RTC:  If worsening or no improvement & as scheduled for chronic dz management.  This note has been created using automated tools and reviewed for accuracy by [facility] KATHRYN GAUGER.   Attestation Statement:   I personally performed the service, non-incident to. [facility])   SARAH KATHRYN GAUGER, NP  Patient seen in collaboration with Dr. Jeffie DOMINO KATHRYN GAUGER, NP

## 2024-07-26 NOTE — Progress Notes (Signed)
 WOCN Consult Services OSTOMY CLINIC VISIT NOTE   Reason for Consult:  - Ileostomy - Initial  Problem List:  Active Problems:   * No active hospital problems. *  History: Per EMR: Pamela Young is a 74 y.o. female with a medical history significant for chronic kidney disease, RA, hypertension, hyperlipidemia, who presented as a transfer from [facility] ED with a intussusception of the transverse colon causing a large bowel obstruction.  Pamela Young was urgently taken to the OR and underwent an extended right hemicolectomy (subtotal colectomy from right colon to descending colon) with end ileostomy creation on 06/24/2024. Intraoperatively, there was an intussusception of transverse colon due to transverse colon mass, which caused the large bowel obstruction. Post operative course complicated by post operative ileus requiring NG tube placement. She passed her clamp trial yesterday and NG tube removed.  Assessment: On arrival pouch was already leaking. Per patient's report, she has been having pouching issues for the last 2 weeks due to the incorrect ostomy supplies. Patient currently has [facility] RN assisting with pouch change and supplies, but the supplies have not been correct and the pouch has leaked almost daily, so they are out of supplies at this time. On assessment, patient has suture granulomas at 3 o'clock and a deep crease due to mid-line incision; I removed 8 staples to be able to clean and apply a new pouch with authorization of provider.   Stoma has decreased in size, now it is 25 mm, but due to crease and suture granulomas, leakage at 3 o'clock has cause erythema, denuded skin and maculopapular rash with satellite lesions. I requested Nystatin powder to provider to treat fungal rash, patient to pick up medication after appointment.   I showed patient and family member how to apply powder and the new pouching system: stomach is soft and decrease stomal size, so we changed to a firm convex  pouching system that it is also smaller. I still had to cut a piece of the border tape to avoid overlapping with the mid-line incision, and added a piece of ring in a triangle shape to fill in the crease at 3 o'clock.        Stoma Type: -  Ileostomy Stoma Location: - RUQ (Right Upper Quadrant)   Stoma Characteristics: - Round - Minimal height - Os location center - Diameter 25 mm Stoma Mucosal Condition and Color: - Moist - Red - Ulceration   Mucocutaneous Junction: - Suture granulomas - fungal rash  Rod/Stents: - No   Output: - Green - Soft - High output   Peristomal Skin Condition:  - Erythema - Denuded - Maceration - Fungal rash - Ulceration - Treated with stoma powder and barrier film  Abdominal Contours: - Rounded - Soft - Creasing at 3 o'clock  Pouching System: - 2 Piece - Convex - Moldable barrier ring - Ostomy belt - barrier strips Anticipated Wear Time of Pouching System: - 3 to 5 days   Teaching/Instructions: - Crusting peristomal skin irritation. - Measuring stoma size and cutting skin barrier appropriately. - Application/adjustment of ostomy belt. - Application of moldable barrier ring. - Obtaining ostomy supplies . - Peristomal wound care . - Reviewed when to contact the outpatient WOC nurse with questions/concerns.  Recommendations/Plan:  - Patient to follow up with outpatient WOC nurse. In 2 weeks  Plan of Care Discussed With: - Patient - Family - RN  - LIP  - CCM   Ostomy Supplies:  - Ostomy pouching samples provided  [employer]  List: Outpatient Supply List  30 day supply   Pouching System  Hollister [phone]) 2 piece wafer extended wear Dori FIRM); 20 per month Hollister (18002) 2 piece pouch with flange drainable (Green, Clear) ; 20 per month  Accessories: Coloplast [phone]) barrier ring, 20 per month Coloplast [phone]) elastic strip, 40 per month Hollister Adhesive remover spray (7737), 2 oz per  month Hollister (7299) ostomy belt large, 1 per month Hollister [id number]) stoma powder, 10 ounces every 6 months 49M (3346) skin barrier spray, 2 ounces per month  WOCN Follow Up: - We will sign off at this time  Workup Time: 75 minutes  Lidiette Wilson BSN, RN, 49M [employer], CFCN Wound, Visual Merchandiser Team [facility]

## 2024-07-28 ENCOUNTER — Encounter: Payer: Self-pay | Admitting: Oncology

## 2024-07-28 ENCOUNTER — Other Ambulatory Visit: Payer: Self-pay | Admitting: Oncology

## 2024-07-28 ENCOUNTER — Other Ambulatory Visit: Payer: Self-pay

## 2024-07-28 ENCOUNTER — Inpatient Hospital Stay: Attending: Oncology | Admitting: Oncology

## 2024-07-28 ENCOUNTER — Inpatient Hospital Stay

## 2024-07-28 VITALS — BP 144/61 | HR 94 | Temp 98.3°F | Resp 18 | Wt 256.0 lb

## 2024-07-28 DIAGNOSIS — C189 Malignant neoplasm of colon, unspecified: Secondary | ICD-10-CM

## 2024-07-28 DIAGNOSIS — Z452 Encounter for adjustment and management of vascular access device: Secondary | ICD-10-CM | POA: Insufficient documentation

## 2024-07-28 DIAGNOSIS — Z5111 Encounter for antineoplastic chemotherapy: Secondary | ICD-10-CM | POA: Diagnosis present

## 2024-07-28 DIAGNOSIS — D649 Anemia, unspecified: Secondary | ICD-10-CM | POA: Insufficient documentation

## 2024-07-28 DIAGNOSIS — N183 Chronic kidney disease, stage 3 unspecified: Secondary | ICD-10-CM | POA: Diagnosis not present

## 2024-07-28 LAB — CBC WITH DIFFERENTIAL/PLATELET
Abs Immature Granulocytes: 0.04 K/uL (ref 0.00–0.07)
Basophils Absolute: 0 K/uL (ref 0.0–0.1)
Basophils Relative: 0 %
Eosinophils Absolute: 0 K/uL (ref 0.0–0.5)
Eosinophils Relative: 1 %
HCT: 33.2 % — ABNORMAL LOW (ref 36.0–46.0)
Hemoglobin: 10 g/dL — ABNORMAL LOW (ref 12.0–15.0)
Immature Granulocytes: 1 %
Lymphocytes Relative: 27 %
Lymphs Abs: 2.2 K/uL (ref 0.7–4.0)
MCH: 24.9 pg — ABNORMAL LOW (ref 26.0–34.0)
MCHC: 30.1 g/dL (ref 30.0–36.0)
MCV: 82.6 fL (ref 80.0–100.0)
Monocytes Absolute: 0.7 K/uL (ref 0.1–1.0)
Monocytes Relative: 8 %
Neutro Abs: 5.2 K/uL (ref 1.7–7.7)
Neutrophils Relative %: 63 %
Platelets: 247 K/uL (ref 150–400)
RBC: 4.02 MIL/uL (ref 3.87–5.11)
RDW: 18.6 % — ABNORMAL HIGH (ref 11.5–15.5)
WBC: 8.2 K/uL (ref 4.0–10.5)
nRBC: 0 % (ref 0.0–0.2)

## 2024-07-28 LAB — CMP (CANCER CENTER ONLY)
ALT: 13 U/L (ref 0–44)
AST: 25 U/L (ref 15–41)
Albumin: 3.6 g/dL (ref 3.5–5.0)
Alkaline Phosphatase: 163 U/L — ABNORMAL HIGH (ref 38–126)
Anion gap: 14 (ref 5–15)
BUN: 10 mg/dL (ref 8–23)
CO2: 21 mmol/L — ABNORMAL LOW (ref 22–32)
Calcium: 8.8 mg/dL — ABNORMAL LOW (ref 8.9–10.3)
Chloride: 102 mmol/L (ref 98–111)
Creatinine: 1.57 mg/dL — ABNORMAL HIGH (ref 0.44–1.00)
GFR, Estimated: 34 mL/min — ABNORMAL LOW
Glucose, Bld: 119 mg/dL — ABNORMAL HIGH (ref 70–99)
Potassium: 4.8 mmol/L (ref 3.5–5.1)
Sodium: 138 mmol/L (ref 135–145)
Total Bilirubin: 0.5 mg/dL (ref 0.0–1.2)
Total Protein: 7.3 g/dL (ref 6.5–8.1)

## 2024-07-28 LAB — MISCELLANEOUS TEST

## 2024-07-28 LAB — ABO/RH: ABO/RH(D): O POS

## 2024-07-28 LAB — SAMPLE TO BLOOD BANK

## 2024-07-28 LAB — IRON AND TIBC
Iron: 31 ug/dL (ref 28–170)
Saturation Ratios: 8 % — ABNORMAL LOW (ref 10.4–31.8)
TIBC: 400 ug/dL (ref 250–450)
UIBC: 369 ug/dL

## 2024-07-28 LAB — FERRITIN: Ferritin: 40 ng/mL (ref 11–307)

## 2024-07-28 MED ORDER — ONDANSETRON HCL 8 MG PO TABS: 8.0000 mg | ORAL_TABLET | Freq: Three times a day (TID) | ORAL | 2 refills | Status: AC | PRN

## 2024-07-28 MED ORDER — LIDOCAINE-PRILOCAINE 2.5-2.5 % EX CREA: TOPICAL_CREAM | CUTANEOUS | 3 refills | Status: AC

## 2024-07-28 MED ORDER — PROCHLORPERAZINE MALEATE 10 MG PO TABS: 10.0000 mg | ORAL_TABLET | Freq: Four times a day (QID) | ORAL | 2 refills | Status: AC | PRN

## 2024-07-28 NOTE — Progress Notes (Signed)
 Patient just had a coloscopy bag placed and she is having problems with it leaking out into her navel and it being red and inflamed. I gave her some supplies that were donate to us  by a patient.

## 2024-07-28 NOTE — Progress Notes (Signed)
 START ON PATHWAY REGIMEN - Colorectal     A cycle is every 14 days:     Oxaliplatin      Leucovorin      Fluorouracil      Fluorouracil   **Always confirm dose/schedule in your pharmacy ordering system**  Patient Characteristics: Postoperative without Neoadjuvant Therapy, M0 (Pathologic Staging), Colon, Stage III, MSS/pMMR, Low Risk (pT1-3, pN1) Therapeutic Status: Postoperative without Neoadjuvant Therapy, M0 (Pathologic Staging) Tumor Location: Colon AJCC T Category: pT3 AJCC N Category: pN1a AJCC M Category: cM0 AJCC 8 Stage Grouping: IIIB Microsatellite/Mismatch Repair Status: MSS/pMMR Intent of Therapy: Curative Intent, Discussed with Patient

## 2024-07-28 NOTE — Patient Instructions (Signed)
 Port a cath placement scheduled for (pending)  Arrive at (pending)  for (pending) appointment Come into the Heart and Vascular entrance at [facility]. This entrance is located in the front of the hospital. Do not eat or drink anything after midnight Take your regularly scheduled blood pressure, pain, or seizure medication. You do not need to hold you blood thinners. You will need a driver for this procedure.

## 2024-07-28 NOTE — Progress Notes (Signed)
 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  INTERVAL HISTORY: Patient is a 74 year old female who experienced 4 to 5 days worth of nausea and vomiting in early December.  Upon evaluation at [facility], CT scan noted telescoping of her colon and patient was taken emergently to surgery revealing the above-stated malignancy.  She continues to have an ostomy but reports this is reversible.  She continues to have fatigue, but otherwise feels well.  She has no neurologic complaints.  She denies any recent fevers.  She has a fair appetite, but denies weight loss.  She has no chest pain, shortness of breath, cough, or hemoptysis.  She denies any nausea, vomiting, constipation, or diarrhea.  She has noticed no melena or hematochezia in her ostomy bag.  She has no urinary complaints.  Patient offers no further specific complaints today.  REVIEW OF SYSTEMS:   Review of Systems  Constitutional:  Positive for malaise/fatigue. Negative for fever and weight loss.  Respiratory: Negative.  Negative for cough, hemoptysis and shortness of breath.   Cardiovascular: Negative.  Negative for chest pain and leg swelling.  Gastrointestinal: Negative.  Negative for abdominal pain.  Genitourinary: Negative.  Negative for dysuria.  Musculoskeletal: Negative.  Negative for back pain.  Skin: Negative.  Negative for rash.  Neurological: Negative.  Negative for dizziness, focal weakness, weakness and headaches.  Psychiatric/Behavioral: Negative.  The patient is not nervous/anxious.     As per HPI. Otherwise, a complete review of systems is negative.  PAST MEDICAL HISTORY: Past Medical History:  Diagnosis Date    Anemia    vitamin d3 deficiency   Arthritis    rheumatoid arthritis, knees, hands, shoulders   Borderline abnormal TFTs 02/10/2018   Overview:  Borderline TSH/FT4, that have improved, but has high TPO antibodies.   Chronic bilateral pleural effusions 08/2018   Chronic gouty arthritis 12/22/2017   CKD (chronic kidney disease) stage 3, GFR 30-59 ml/min (HCC) 01/27/2017   Overview:  Overview:  Being followed by Dr. Marcelino Overview:  Being followed by Dr. Marcelino   Coronary artery disease involving native coronary artery of native heart 02/17/2017   Overview:  By ct scan    Dyspnea    prior to removal of fluids   Elevated erythrocyte sedimentation rate 12/10/2017   GERD (gastroesophageal reflux disease)    Hyperlipemia    Hyperlipidemia 05/31/2014   Hypertension    Hyperuricemia 12/10/2017   Positive anti-CCP test 12/22/2017   Last Assessment & Plan:  CCP+ at 46 Negative RF   Renal disorder    Secondary hyperparathyroidism 07/21/2015   Overview:  Overview:  Followed by Dr. Marcelino Overview:  Followed by Dr. Lateef   Vitamin D  deficiency 07/21/2015   Overview:  Overview:  Vit D 15 (checked thru nephrology, who is supplementing w/ ergocalciferol ). Overview:  Vit D 15 (checked thru nephrology, who is supplementing w/ ergocalciferol ).   Wears dentures     PAST SURGICAL HISTORY: Past Surgical History:  Procedure Laterality Date   CATARACT EXTRACTION W/PHACO Right 10/24/2019   Procedure: CATARACT EXTRACTION PHACO AND INTRAOCULAR LENS PLACEMENT (IOC) RIGHT VISION BLUE 5.82 00:37.3 ;  Surgeon: Ferol Rogue, MD;  Location: [facility];  Service: Ophthalmology;  Laterality: Right;   CATARACT EXTRACTION  W/PHACO Left 11/23/2019   Procedure: CATARACT EXTRACTION PHACO AND INTRAOCULAR LENS PLACEMENT (IOC) LEFT VISION BLUE;  Surgeon: Ferol Rogue, MD;  Location: [facility];  Service: Ophthalmology;  Laterality: Left;   CHEST TUBE INSERTION Left 09/27/2018   Procedure: CHEST TUBE INSERTION;   Surgeon: Volney Lye, MD;  Location: [facility] ORS;  Service: General;  Laterality: Left;   DILATION AND CURETTAGE OF UTERUS  2000   THORACENTESIS Left 08/2018   THORACOTOMY Left 09/27/2018   Procedure: Bronchoscopy with pleural biopsy and talc  pleurodesis;  Surgeon: Volney Lye, MD;  Location: [facility] ORS;  Service: General;  Laterality: Left;   TUBAL LIGATION      FAMILY HISTORY: Family History  Problem Relation Age of Onset   Heart attack Mother    Heart attack Father    Hypertension Sister    Hypertension Brother     ADVANCED DIRECTIVES (Y/N):  N  HEALTH MAINTENANCE: Social History[1]   Colonoscopy:  PAP:  Bone density:  Lipid panel:  Allergies[2]  Current Outpatient Medications  Medication Sig Dispense Refill   acetaminophen  (TYLENOL ) 500 MG tablet Take 500 mg by mouth every 6 (six) hours as needed.     amLODipine  (NORVASC ) 10 MG tablet Take 10 mg by mouth daily.     atenolol  (TENORMIN ) 25 MG tablet Take 25 mg by mouth daily.     atorvastatin  (LIPITOR) 40 MG tablet Take 1 tablet by mouth daily. Takes in evening  4   Cholecalciferol  (VITAMIN D3) 25 MCG (1000 UT) CAPS Take 2,000 Int'l Units by mouth daily.     furosemide  (LASIX ) 20 MG tablet Take 1 tablet (20 mg total) by mouth daily. 30 tablet 11   hydroxychloroquine  (PLAQUENIL ) 200 MG tablet Take by mouth daily. Takes in the evening for gout     lidocaine -prilocaine  (EMLA ) cream Apply to affected area once 30 g 3   losartan  (COZAAR ) 100 MG tablet Take 100 mg by mouth daily. Takes in the morning     ondansetron  (ZOFRAN ) 8 MG tablet Take 1 tablet (8 mg total) by mouth every 8 (eight) hours as needed for nausea or vomiting. Start on the third day after chemotherapy. 60 tablet 2   potassium chloride  SA (K-DUR,KLOR-CON ) 20 MEQ tablet Take 1 tablet (20 mEq total) by mouth daily. 30 tablet 5   prochlorperazine  (COMPAZINE ) 10 MG tablet Take 1 tablet (10 mg total) by mouth every 6 (six) hours as needed for nausea or vomiting. 60  tablet 2   sodium bicarbonate 650 MG tablet Take 650 mg by mouth 2 (two) times daily.     triamterene -hydrochlorothiazide  (MAXZIDE ) 75-50 MG tablet Take 1 tablet by mouth daily.     No current facility-administered medications for this visit.    OBJECTIVE: Vitals:   07/28/24 1146  BP: (!) 144/61  Pulse: 94  Resp: 18  Temp: 98.3 F (36.8 C)  SpO2: 100%     Body mass index is 40.1 kg/m.    ECOG FS:1 - Symptomatic but completely ambulatory  General: Well-developed, well-nourished, no acute distress. Eyes: Pink conjunctiva, anicteric sclera. HEENT: Normocephalic, moist mucous membranes. Lungs: No audible wheezing or coughing. Heart: Regular rate and rhythm. Abdomen: Soft, nontender, no obvious distention.  Colostomy bag noted. Musculoskeletal: No edema, cyanosis, or clubbing. Neuro: Alert, answering all questions appropriately. Cranial nerves grossly intact. Skin: No rashes or petechiae noted. Psych: Normal affect. Lymphatics: No cervical, calvicular, axillary or inguinal LAD.   LAB RESULTS:  Lab Results  Component Value Date   NA 130 (  L) 10/21/2018   K 4.3 10/21/2018   CL 102 10/21/2018   CO2 19 (L) 10/21/2018   GLUCOSE 98 10/21/2018   BUN 31 (H) 10/21/2018   CREATININE 1.79 (H) 10/21/2018   CALCIUM  8.3 (L) 10/21/2018   PROT 7.7 10/20/2018   ALBUMIN 3.1 (L) 10/20/2018   AST 21 10/20/2018   ALT 14 10/20/2018   ALKPHOS 131 (H) 10/20/2018   BILITOT 0.7 10/20/2018   GFRNONAA 29 (L) 10/21/2018   GFRAA 33 (L) 10/21/2018    Lab Results  Component Value Date   WBC 12.0 (H) 10/21/2018   NEUTROABS 4.5 09/23/2018   HGB 9.4 (L) 10/21/2018   HCT 29.3 (L) 10/21/2018   MCV 87.2 10/21/2018   PLT 344 10/21/2018     STUDIES: No results found.  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  She was noted to have 1 of 21 lymph  nodes positive for disease.  CT scan of the abdomen and pelvis on June 24, 2024 at [facility] did not reveal any metastatic disease.  Postoperative CEA was reported within normal limits.  Tumor exhibits a microsatellite stable (MSS) phenotype.  Patient will benefit from adjuvant FOLFOX every 2 weeks for 12 cycles.  Prior to initiating treatment patient will require port placement as well as DPD testing.  Return to clinic on August 14, 2024 to initiate cycle 1 of 12. Anemia: Patient's hemoglobin upon discharge from [facility] was approximately 7.8.  Repeat laboratory work from today is pending.  She will likely require iron fusions in the future. Ostomy: Continue follow-up with home health care as well as ostomy nurse.  I spent a total of 60 minutes reviewing chart data, face-to-face evaluation with the patient, counseling and coordination of care as detailed above.  Patient expressed understanding and was in agreement with this plan. She also understands that She can call clinic at any time with any questions, concerns, or complaints.    Cancer Staging  Adenocarcinoma, colon (HCC) Staging form: Colon and Rectum, AJCC 8th Edition - Pathologic stage from 07/28/2024: Stage IIIB (pT3, pN1a, cM0) - Signed by Jacobo Evalene PARAS, MD on 07/28/2024 Stage prefix: Initial diagnosis Total positive nodes: 1   Evalene PARAS Jacobo, MD   07/28/2024 12:23 PM         [1]  Social History Tobacco Use   Smoking status: Former    Current packs/day: 0.00    Average packs/day: 1.0 packs/day    Types: Cigarettes    Quit date: 2008    Years since quitting: 18.0   Smokeless tobacco: Never  Vaping Use   Vaping status: Never Used  Substance Use Topics   Alcohol use: No   Drug use: No  [2]  Allergies Allergen Reactions   Ibuprofen Other (See Comments)    Can not take due to kidney disease   Codeine     'makes me feel funny'   "

## 2024-07-28 NOTE — Progress Notes (Signed)
 Met with Pamela Young and her daughter. Provided journey notebook and reviewed navigator role with patient. Guardant 360 drawn for DYPD compliance. Port being scheduled. Encouraged to call with any needs prior to chemo start.

## 2024-07-29 ENCOUNTER — Other Ambulatory Visit: Payer: Self-pay

## 2024-07-29 LAB — CEA: CEA: 1.6 ng/mL (ref 0.0–4.7)

## 2024-07-31 ENCOUNTER — Other Ambulatory Visit: Payer: Self-pay

## 2024-07-31 ENCOUNTER — Telehealth: Payer: Self-pay

## 2024-07-31 NOTE — Telephone Encounter (Signed)
 Port a cath placement scheduled for 08/08/2024  Arrive at 1000  for 1100 appointment Come into the Heart and Vascular entrance at [facility]. This entrance is located in the front of the hospital Do not eat or drink anything after midnight Take your regularly scheduled blood pressure, pain, or seizure medication You do not need to hold you blood thinners You will need a driver for this procedure.  Spoke with daughter, Stephane, and reviewed port instructions.

## 2024-07-31 NOTE — Progress Notes (Signed)
 Pharmacist Chemotherapy Monitoring - Initial Assessment    Anticipated start date: 08/14/24   The following has been reviewed per standard work regarding the patient's treatment regimen: The patient's diagnosis, treatment plan and drug doses, and organ/hematologic function Lab orders and baseline tests specific to treatment regimen  The treatment plan start date, drug sequencing, and pre-medications Prior authorization status  Patient's documented medication list, including drug-drug interaction screen and prescriptions for anti-emetics and supportive care specific to the treatment regimen The drug concentrations, fluid compatibility, administration routes, and timing of the medications to be used The patient's access for treatment and lifetime cumulative dose history, if applicable  The patient's medication allergies and previous infusion related reactions, if applicable  stage IIIb adenocarcinoma of the colon.  Tumor exhibits a microsatellite stable (MSS) phenotype. Patient will benefit from adjuvant FOLFOX every 2 weeks for 12 cycles  Follow up needed:  Check results Of DPD test   Pamela Young, [facility], 07/31/2024  12:45 PM

## 2024-08-01 ENCOUNTER — Other Ambulatory Visit: Payer: Self-pay

## 2024-08-07 ENCOUNTER — Encounter: Payer: Self-pay | Admitting: Oncology

## 2024-08-07 ENCOUNTER — Telehealth: Payer: Self-pay

## 2024-08-07 ENCOUNTER — Inpatient Hospital Stay

## 2024-08-07 DIAGNOSIS — C189 Malignant neoplasm of colon, unspecified: Secondary | ICD-10-CM

## 2024-08-07 NOTE — Telephone Encounter (Signed)
 Patient for IR Port Placement on Tues 08/08/24, I called and spoke with the patient on the phone and gave pre-procedure instructions. Pt was made aware to be here at 10a, NPO after MN prior to procedure as well as driver post procedure/recovery/discharge. Pt stated understanding.  Called 08/07/24

## 2024-08-08 ENCOUNTER — Encounter: Payer: Self-pay | Admitting: Radiology

## 2024-08-08 ENCOUNTER — Ambulatory Visit: Admission: RE | Admit: 2024-08-08 | Discharge: 2024-08-08 | Attending: Oncology | Admitting: Oncology

## 2024-08-08 ENCOUNTER — Other Ambulatory Visit: Payer: Self-pay

## 2024-08-08 VITALS — BP 118/46 | HR 63 | Temp 97.5°F | Resp 15

## 2024-08-08 DIAGNOSIS — C189 Malignant neoplasm of colon, unspecified: Secondary | ICD-10-CM | POA: Insufficient documentation

## 2024-08-08 DIAGNOSIS — Z87891 Personal history of nicotine dependence: Secondary | ICD-10-CM | POA: Diagnosis not present

## 2024-08-08 DIAGNOSIS — Z01818 Encounter for other preprocedural examination: Secondary | ICD-10-CM

## 2024-08-08 HISTORY — PX: IR IMAGING GUIDED PORT INSERTION: IMG5740

## 2024-08-08 MED ORDER — HEPARIN SOD (PORK) LOCK FLUSH 100 UNIT/ML IV SOLN
INTRAVENOUS | Status: AC
  Filled 2024-08-08: qty 5

## 2024-08-08 MED ORDER — MIDAZOLAM HCL (PF) 2 MG/2ML IJ SOLN
INTRAMUSCULAR | Status: AC | PRN
  Administered 2024-08-08 (×2): 1 mg via INTRAVENOUS

## 2024-08-08 MED ORDER — HEPARIN SOD (PORK) LOCK FLUSH 100 UNIT/ML IV SOLN
500.0000 [IU] | Freq: Once | INTRAVENOUS | Status: AC
  Administered 2024-08-08: 500 [IU] via INTRAVENOUS

## 2024-08-08 MED ORDER — LIDOCAINE-EPINEPHRINE 1 %-1:100000 IJ SOLN
10.0000 mL | Freq: Once | INTRAMUSCULAR | Status: AC
  Administered 2024-08-08: 10 mL via INTRADERMAL

## 2024-08-08 MED ORDER — FENTANYL CITRATE (PF) 100 MCG/2ML IJ SOLN
INTRAMUSCULAR | Status: AC
  Filled 2024-08-08: qty 2

## 2024-08-08 MED ORDER — SODIUM CHLORIDE 0.9 % IV SOLN: INTRAVENOUS | Status: DC

## 2024-08-08 MED ORDER — LIDOCAINE-EPINEPHRINE 1 %-1:100000 IJ SOLN
INTRAMUSCULAR | Status: AC
  Filled 2024-08-08: qty 1

## 2024-08-08 MED ORDER — FENTANYL CITRATE (PF) 100 MCG/2ML IJ SOLN
INTRAMUSCULAR | Status: AC | PRN
  Administered 2024-08-08 (×2): 50 ug via INTRAVENOUS

## 2024-08-08 MED ORDER — LIDOCAINE HCL 1 % IJ SOLN
INTRAMUSCULAR | Status: AC
  Filled 2024-08-08: qty 20

## 2024-08-08 MED ORDER — LIDOCAINE HCL 1 % IJ SOLN
10.0000 mL | Freq: Once | INTRAMUSCULAR | Status: AC
  Administered 2024-08-08: 10 mL via INTRADERMAL

## 2024-08-08 MED ORDER — MIDAZOLAM HCL 2 MG/2ML IJ SOLN
INTRAMUSCULAR | Status: AC
  Filled 2024-08-08: qty 2

## 2024-08-08 NOTE — H&P (Signed)
 "     Chief Complaint: Patient was seen in consultation today for colon cancer; pending adjuvant chemotherapy.   Referring Physician(s): Finnegan,Timothy J  Supervising Physician: Philip Cornet  Patient Status: [facility] - Out-pt  History of Present Illness: Pamela Young is a 74 y.o. female with a medical history significant for anemia, arthritis, gout, CKD, CAD, HTN and recently diagnosed colon cancer. She presented to [facility] ED at [facility] 06/24/24 with nausea/vomiting and diarrhea. She was found to have an intussusception of the large intestine. She was emergently taken to the OR and found to have a transverse colon mass. She underwent subtotal colectomy with creation of end ileostomy. The pathology returned positive for adenocarcinoma. Her oncology team is preparing her for adjuvant chemotherapy and she will require durable venous access.   Interventional Radiology has been asked to evaluate this patient for an image-guided port-a-catheter placement to facilitate her treatment plans.   Past Medical History:  Diagnosis Date   Anemia    vitamin d3 deficiency   Arthritis    rheumatoid arthritis, knees, hands, shoulders   Borderline abnormal TFTs 02/10/2018   Overview:  Borderline TSH/FT4, that have improved, but has high TPO antibodies.   Chronic bilateral pleural effusions 08/2018   Chronic gouty arthritis 12/22/2017   CKD (chronic kidney disease) stage 3, GFR 30-59 ml/min (HCC) 01/27/2017   Overview:  Overview:  Being followed by Dr. Marcelino Overview:  Being followed by Dr. Marcelino   Coronary artery disease involving native coronary artery of native heart 02/17/2017   Overview:  By ct scan    Dyspnea    prior to removal of fluids   Elevated erythrocyte sedimentation rate 12/10/2017   GERD (gastroesophageal reflux disease)    Hyperlipemia    Hyperlipidemia 05/31/2014   Hypertension    Hyperuricemia 12/10/2017   Positive anti-CCP test 12/22/2017   Last Assessment & Plan:  CCP+ at 46 Negative RF    Renal disorder    Secondary hyperparathyroidism 07/21/2015   Overview:  Overview:  Followed by Dr. Marcelino Overview:  Followed by Dr. Lateef   Vitamin D  deficiency 07/21/2015   Overview:  Overview:  Vit D 15 (checked thru nephrology, who is supplementing w/ ergocalciferol ). Overview:  Vit D 15 (checked thru nephrology, who is supplementing w/ ergocalciferol ).   Wears dentures     Past Surgical History:  Procedure Laterality Date   CATARACT EXTRACTION W/PHACO Right 10/24/2019   Procedure: CATARACT EXTRACTION PHACO AND INTRAOCULAR LENS PLACEMENT (IOC) RIGHT VISION BLUE 5.82 00:37.3 ;  Surgeon: Ferol Rogue, MD;  Location: [facility];  Service: Ophthalmology;  Laterality: Right;   CATARACT EXTRACTION W/PHACO Left 11/23/2019   Procedure: CATARACT EXTRACTION PHACO AND INTRAOCULAR LENS PLACEMENT (IOC) LEFT VISION BLUE;  Surgeon: Ferol Rogue, MD;  Location: [facility];  Service: Ophthalmology;  Laterality: Left;   CHEST TUBE INSERTION Left 09/27/2018   Procedure: CHEST TUBE INSERTION;  Surgeon: Volney Lye, MD;  Location: [facility] ORS;  Service: General;  Laterality: Left;   DILATION AND CURETTAGE OF UTERUS  2000   THORACENTESIS Left 08/2018   THORACOTOMY Left 09/27/2018   Procedure: Bronchoscopy with pleural biopsy and talc  pleurodesis;  Surgeon: Volney Lye, MD;  Location: [facility] ORS;  Service: General;  Laterality: Left;   TUBAL LIGATION      Allergies: Ibuprofen and Codeine  Medications: Prior to Admission medications  Medication Sig Start Date End Date Taking? Authorizing Provider  acetaminophen  (TYLENOL ) 500 MG tablet Take 500 mg by mouth every 6 (six) hours as needed.  Yes [provider]  amLODipine  (NORVASC ) 10 MG tablet Take 10 mg by mouth daily.   Yes [provider]  atenolol  (TENORMIN ) 25 MG tablet Take 25 mg by mouth daily.   Yes [provider]  atorvastatin  (LIPITOR) 40 MG tablet Take 1 tablet by mouth daily. Takes in evening 11/17/17  Yes  [provider]  Cholecalciferol  (VITAMIN D3) 25 MCG (1000 UT) CAPS Take 2,000 Int'l Units by mouth daily.   Yes [provider]  furosemide  (LASIX ) 20 MG tablet Take 1 tablet (20 mg total) by mouth daily. 10/24/18 08/08/24 Yes Verdia Art, MD  hydroxychloroquine  (PLAQUENIL ) 200 MG tablet Take by mouth daily. Takes in the evening for gout   Yes [provider]  losartan  (COZAAR ) 100 MG tablet Take 100 mg by mouth daily. Takes in the morning   Yes [provider]  sodium bicarbonate 650 MG tablet Take 650 mg by mouth 2 (two) times daily.   Yes [provider]  triamterene -hydrochlorothiazide  (MAXZIDE ) 75-50 MG tablet Take 1 tablet by mouth daily.   Yes [provider]  lidocaine -prilocaine  (EMLA ) cream Apply to affected area once 07/28/24   Jacobo Evalene PARAS, MD  ondansetron  (ZOFRAN ) 8 MG tablet Take 1 tablet (8 mg total) by mouth every 8 (eight) hours as needed for nausea or vomiting. Start on the third day after chemotherapy. 07/28/24   Jacobo Evalene PARAS, MD  potassium chloride  SA (K-DUR,KLOR-CON ) 20 MEQ tablet Take 1 tablet (20 mEq total) by mouth daily. 10/24/18   Verdia Art, MD  prochlorperazine  (COMPAZINE ) 10 MG tablet Take 1 tablet (10 mg total) by mouth every 6 (six) hours as needed for nausea or vomiting. 07/28/24   Jacobo Evalene PARAS, MD     Family History  Problem Relation Age of Onset   Heart attack Mother    Heart attack Father    Hypertension Sister    Hypertension Brother     Social History   Socioeconomic History   Marital status: Married    Spouse name: Marcey   Number of children: Not on file   Years of education: Not on file   Highest education level: Not on file  Occupational History   Occupation: h[employer]iery [employer]    Comment: retired  Tobacco Use   Smoking status: Former    Current packs/day: 0.00    Average packs/day: 1.0 packs/day    Types: Cigarettes    Quit date: 2008    Years since quitting:  18.0   Smokeless tobacco: Never  Vaping Use   Vaping status: Never Used  Substance and Sexual Activity   Alcohol use: No   Drug use: No   Sexual activity: Not on file  Other Topics Concern   Not on file  Social History Narrative   Not on file   Social Drivers of Health   Tobacco Use: Medium Risk (08/08/2024)   Patient History    Smoking Tobacco Use: Former    Smokeless Tobacco Use: Never    Passive Exposure: Not on Actuary Strain: Low Risk  (04/03/2024)   Received from [facility]   Overall Financial Resource Strain (CARDIA)    Difficulty of Paying Living Expenses: Not hard at all  Food Insecurity: No Food Insecurity (07/26/2024)   Received from [facility]   Epic    Within the past 12 months, you worried that your food would run out before you got the money to buy more.: Never true  Within the past 12 months, the food you bought just didn't last and you didn't have money to get more.: Never true  Transportation Needs: No Transportation Needs (07/26/2024)   Received from [facility] - Transportation    Lack of Transportation (Medical): No    Lack of Transportation (Non-Medical): No  Physical Activity: Not on file  Stress: Not on file  Social Connections: Not on file  Depression (EYV7-0): Not on file  Alcohol Screen: Not on file  Housing: Low Risk  (04/03/2024)   Received from [facility]   Epic    In the last 12 months, was there a time when you were not able to pay the mortgage or rent on time?: No    In the past 12 months, how many times have you moved where you were living?: 0    At any time in the past 12 months, were you homeless or living in a shelter (including now)?: No  Utilities: Low Risk (07/26/2024)   Received from [facility]   Utilities    Within the past 12 months, have you been unable to get utilities(heat, electricity) when it was really needed?: No  Health Literacy: Not on file     Review of Systems: A 12 point ROS discussed and pertinent positives are indicated in the HPI above.  All other systems are negative.  Review of Systems  All other systems reviewed and are negative.   Vital Signs: BP (!) 147/61   Pulse 81   Temp (!) 97.5 F (36.4 C) (Temporal)   Resp 15   SpO2 98%   Physical Exam Constitutional:      General: She is not in acute distress.    Appearance: She is obese. She is not ill-appearing.  HENT:     Mouth/Throat:     Mouth: Mucous membranes are dry.     Pharynx: Oropharynx is clear.  Cardiovascular:     Rate and Rhythm: Normal rate.  Pulmonary:     Effort: Pulmonary effort is normal.  Abdominal:     Comments: Left abdominal ileostomy  Skin:    General: Skin is warm and dry.  Neurological:     Mental Status: She is alert and oriented to person, place, and time.     Imaging: No results found.  Labs:  CBC: Recent Labs    07/28/24 1227  WBC 8.2  HGB 10.0*  HCT 33.2*  PLT 247    COAGS: No results for input(s): INR, APTT in the last 8760 hours.  BMP: Recent Labs    07/28/24 1227  NA 138  K 4.8  CL 102  CO2 21*  GLUCOSE 119*  BUN 10  CALCIUM  8.8*  CREATININE 1.57*  GFRNONAA 34*    LIVER FUNCTION TESTS: Recent Labs    07/28/24 1227  BILITOT 0.5  AST 25  ALT 13  ALKPHOS 163*  PROT 7.3  ALBUMIN 3.6    TUMOR MARKERS: No results for input(s): AFPTM, CEA, CA199, CHROMGRNA in the last 8760 hours.  Assessment and Plan:  Colon cancer; pending adjuvant chemotherapy: Katherinne Mofield, 74 year old female, presents today to the [facility] Interventional Radiology department for an image-guided port-a-catheter placement.   Risks and benefits of image-guided port-a-catheter placement were discussed with the patient including, but not limited to bleeding, infection, pneumothorax, or fibrin sheath development and need for additional procedures.  All of the patient's questions were  answered, patient is agreeable to proceed. She  has been NPO.   Consent signed and in chart.  Thank you for this interesting consult.  I greatly enjoyed meeting Madgeline Rayo and look forward to participating in their care.  A copy of this report was sent to the requesting provider on this date.  Electronically Signed: Warren Dais, AGACNP-BC 08/08/2024, 10:29 AM   I spent a total of  30 Minutes   in face to face in clinical consultation, greater than 50% of which was counseling/coordinating care for colon cancer.   "

## 2024-08-08 NOTE — Procedures (Signed)
Interventional Radiology Procedure:   Indications: Colon cancer  Procedure: Port placement  Findings: Right jugular port, tip at SVC/RA junction  Complications: None     EBL: Minimal, less than 10 ml  Plan: Discharge in one hour.  Keep port site and incisions dry for at least 24 hours.     Lulubelle Simcoe R. Anselm Pancoast, MD  Pager: [phone]

## 2024-08-08 NOTE — Progress Notes (Signed)
 Patient clinically stable post IR Port placement per DR Philip, tolerated well. Vitals stable post procedure. Received Versed  2 mg along with Fentanyl  100 mcg IV for procedure. Report given to [facility] RN post procedure/specials/21

## 2024-08-09 ENCOUNTER — Inpatient Hospital Stay

## 2024-08-09 NOTE — Progress Notes (Signed)
 [facility] Clinical Social Work  Clinical Social Work was referred by distress screen protocol for distress screen needs.  Clinical Social Work Intern contacted patient by phone to offer support and assess for needs.   Patient reports that she has good family support, with grown children and grandchildren in the area, as well as her sisters who also live close by. Patients denies any resource or emotional needs at this time.  Interventions: Discussed common feeling and emotions when being diagnosed with cancer, and the importance of support during treatment Informed patient of the support team roles and support services at [facility] Provided CSW contact information and encouraged patient to call with any questions or concerns      Follow Up Plan:  Patient will contact CSW with any support or resource needs    Thersia KATHEE Daring  Clinical Social Work Intern [facility]

## 2024-08-11 ENCOUNTER — Other Ambulatory Visit: Payer: Self-pay | Admitting: Oncology

## 2024-08-11 ENCOUNTER — Telehealth: Payer: Self-pay | Admitting: Oncology

## 2024-08-11 ENCOUNTER — Telehealth: Payer: Self-pay

## 2024-08-11 DIAGNOSIS — C189 Malignant neoplasm of colon, unspecified: Secondary | ICD-10-CM

## 2024-08-11 NOTE — Telephone Encounter (Signed)
 Due to weather, clinic is going to be closed on 1/26. I called pt and lvm that her appts on 1/26 have been canceled and that when we have an updated order from the MD regarding tx we will call pt back.

## 2024-08-11 NOTE — Telephone Encounter (Signed)
 Research nurse attempted to call patient to introduce the Cosmic protocol to see if she may be interested in participating. I will attempt to try and reach her again possibly next week, prior to her next visit.  Reena Romans, RN 08/11/24 4:23 PM

## 2024-08-12 ENCOUNTER — Other Ambulatory Visit: Payer: Self-pay

## 2024-08-14 ENCOUNTER — Inpatient Hospital Stay

## 2024-08-14 ENCOUNTER — Inpatient Hospital Stay: Admitting: Oncology

## 2024-08-16 ENCOUNTER — Inpatient Hospital Stay

## 2024-08-16 ENCOUNTER — Encounter: Payer: Self-pay | Admitting: Oncology

## 2024-08-16 ENCOUNTER — Inpatient Hospital Stay: Admitting: Oncology

## 2024-08-16 VITALS — BP 160/80 | HR 66 | Temp 96.1°F | Resp 16 | Wt 256.0 lb

## 2024-08-16 DIAGNOSIS — C189 Malignant neoplasm of colon, unspecified: Secondary | ICD-10-CM | POA: Diagnosis not present

## 2024-08-16 DIAGNOSIS — Z5111 Encounter for antineoplastic chemotherapy: Secondary | ICD-10-CM | POA: Diagnosis not present

## 2024-08-16 LAB — CBC WITH DIFFERENTIAL (CANCER CENTER ONLY)
Abs Immature Granulocytes: 0.02 10*3/uL (ref 0.00–0.07)
Basophils Absolute: 0 10*3/uL (ref 0.0–0.1)
Basophils Relative: 0 %
Eosinophils Absolute: 0.2 10*3/uL (ref 0.0–0.5)
Eosinophils Relative: 3 %
HCT: 31.2 % — ABNORMAL LOW (ref 36.0–46.0)
Hemoglobin: 9.4 g/dL — ABNORMAL LOW (ref 12.0–15.0)
Immature Granulocytes: 0 %
Lymphocytes Relative: 44 %
Lymphs Abs: 3.1 10*3/uL (ref 0.7–4.0)
MCH: 24.7 pg — ABNORMAL LOW (ref 26.0–34.0)
MCHC: 30.1 g/dL (ref 30.0–36.0)
MCV: 81.9 fL (ref 80.0–100.0)
Monocytes Absolute: 1 10*3/uL (ref 0.1–1.0)
Monocytes Relative: 15 %
Neutro Abs: 2.7 10*3/uL (ref 1.7–7.7)
Neutrophils Relative %: 38 %
Platelet Count: 192 10*3/uL (ref 150–400)
RBC: 3.81 MIL/uL — ABNORMAL LOW (ref 3.87–5.11)
RDW: 17.2 % — ABNORMAL HIGH (ref 11.5–15.5)
WBC Count: 7.1 10*3/uL (ref 4.0–10.5)
nRBC: 0 % (ref 0.0–0.2)

## 2024-08-16 LAB — CMP (CANCER CENTER ONLY)
ALT: 13 U/L (ref 0–44)
AST: 25 U/L (ref 15–41)
Albumin: 3.4 g/dL — ABNORMAL LOW (ref 3.5–5.0)
Alkaline Phosphatase: 143 U/L — ABNORMAL HIGH (ref 38–126)
Anion gap: 12 (ref 5–15)
BUN: 14 mg/dL (ref 8–23)
CO2: 24 mmol/L (ref 22–32)
Calcium: 9.3 mg/dL (ref 8.9–10.3)
Chloride: 102 mmol/L (ref 98–111)
Creatinine: 1.52 mg/dL — ABNORMAL HIGH (ref 0.44–1.00)
GFR, Estimated: 36 mL/min — ABNORMAL LOW
Glucose, Bld: 104 mg/dL — ABNORMAL HIGH (ref 70–99)
Potassium: 4.2 mmol/L (ref 3.5–5.1)
Sodium: 138 mmol/L (ref 135–145)
Total Bilirubin: 0.3 mg/dL (ref 0.0–1.2)
Total Protein: 6.8 g/dL (ref 6.5–8.1)

## 2024-08-16 MED ORDER — DEXAMETHASONE SOD PHOSPHATE PF 10 MG/ML IJ SOLN
10.0000 mg | Freq: Once | INTRAMUSCULAR | Status: AC
  Administered 2024-08-16: 10 mg via INTRAVENOUS
  Filled 2024-08-16: qty 1

## 2024-08-16 MED ORDER — DEXTROSE 5 % IV SOLN
INTRAVENOUS | Status: DC
  Filled 2024-08-16: qty 250

## 2024-08-16 MED ORDER — PALONOSETRON HCL INJECTION 0.25 MG/5ML
0.2500 mg | Freq: Once | INTRAVENOUS | Status: AC
  Administered 2024-08-16: 0.25 mg via INTRAVENOUS
  Filled 2024-08-16: qty 5

## 2024-08-16 MED ORDER — OXALIPLATIN CHEMO INJECTION 100 MG/20ML
85.0000 mg/m2 | Freq: Once | INTRAVENOUS | Status: AC
  Administered 2024-08-16: 200 mg via INTRAVENOUS
  Filled 2024-08-16: qty 40

## 2024-08-16 MED ORDER — LEUCOVORIN CALCIUM INJECTION 350 MG
400.0000 mg/m2 | Freq: Once | INTRAVENOUS | Status: AC
  Administered 2024-08-16: 936 mg via INTRAVENOUS
  Filled 2024-08-16: qty 46.8

## 2024-08-16 MED ORDER — SODIUM CHLORIDE 0.9 % IV SOLN
2400.0000 mg/m2 | INTRAVENOUS | Status: DC
  Administered 2024-08-16: 5600 mg via INTRAVENOUS
  Filled 2024-08-16: qty 112

## 2024-08-16 MED ORDER — FLUOROURACIL CHEMO INJECTION 2.5 GM/50ML
400.0000 mg/m2 | Freq: Once | INTRAVENOUS | Status: AC
  Administered 2024-08-16: 1000 mg via INTRAVENOUS
  Filled 2024-08-16: qty 20

## 2024-08-16 NOTE — Progress Notes (Signed)
 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  INTERVAL HISTORY: Patient returns to clinic today for further evaluation and consideration of cycle 1 of 12 of adjuvant FOLFOX.  She currently feels well and is back to her baseline.  She does not complain of weakness or fatigue today.  She has no neurologic complaints.  She denies any recent fevers or illnesses.  She has a fair appetite, but denies weight loss.  She has no chest pain, shortness of breath, cough, or hemoptysis.  She denies any nausea, vomiting, constipation, or diarrhea.  She has noticed no melena or hematochezia in her ostomy bag.  She has no urinary complaints.  Patient offers no specific complaints today.  REVIEW OF SYSTEMS:   Review of Systems  Constitutional: Negative.  Negative for fever, malaise/fatigue and weight loss.  Respiratory: Negative.  Negative for cough, hemoptysis and shortness of breath.   Cardiovascular: Negative.  Negative for chest pain and leg swelling.  Gastrointestinal: Negative.  Negative for abdominal pain.  Genitourinary: Negative.  Negative for dysuria.  Musculoskeletal: Negative.  Negative for back pain.  Skin: Negative.  Negative for rash.  Neurological: Negative.  Negative for dizziness, focal weakness, weakness and headaches.  Psychiatric/Behavioral: Negative.  The patient is not nervous/anxious.     As per HPI. Otherwise, a complete review of systems is negative.  PAST MEDICAL HISTORY: Past Medical History:  Diagnosis Date   Anemia    vitamin d3 deficiency   Arthritis    rheumatoid arthritis, knees, hands, shoulders   Borderline  abnormal TFTs 02/10/2018   Overview:  Borderline TSH/FT4, that have improved, but has high TPO antibodies.   Chronic bilateral pleural effusions 08/2018   Chronic gouty arthritis 12/22/2017   CKD (chronic kidney disease) stage 3, GFR 30-59 ml/min (HCC) 01/27/2017   Overview:  Overview:  Being followed by Dr. Marcelino Overview:  Being followed by Dr. Marcelino   Coronary artery disease involving native coronary artery of native heart 02/17/2017   Overview:  By ct scan    Dyspnea    prior to removal of fluids   Elevated erythrocyte sedimentation rate 12/10/2017   GERD (gastroesophageal reflux disease)    Hyperlipemia    Hyperlipidemia 05/31/2014   Hypertension    Hyperuricemia 12/10/2017   Positive anti-CCP test 12/22/2017   Last Assessment & Plan:  CCP+ at 46 Negative RF   Renal disorder    Secondary hyperparathyroidism 07/21/2015   Overview:  Overview:  Followed by Dr. Marcelino Overview:  Followed by Dr. Lateef   Vitamin D  deficiency 07/21/2015   Overview:  Overview:  Vit D 15 (checked thru nephrology, who is supplementing w/ ergocalciferol ). Overview:  Vit D 15 (checked thru nephrology, who is supplementing w/ ergocalciferol ).   Wears dentures     PAST SURGICAL HISTORY: Past Surgical History:  Procedure Laterality Date   CATARACT EXTRACTION W/PHACO Right 10/24/2019   Procedure: CATARACT EXTRACTION PHACO AND INTRAOCULAR LENS PLACEMENT (IOC) RIGHT VISION BLUE 5.82 00:37.3 ;  Surgeon: Ferol Rogue, MD;  Location: [facility];  Service: Ophthalmology;  Laterality: Right;   CATARACT EXTRACTION W/PHACO Left 11/23/2019   Procedure: CATARACT EXTRACTION PHACO AND INTRAOCULAR LENS PLACEMENT (IOC) LEFT VISION BLUE;  Surgeon: Ferol Rogue, MD;  Location: [facility];  Service: Ophthalmology;  Laterality: Left;   CHEST TUBE INSERTION Left 09/27/2018   Procedure: CHEST TUBE INSERTION;  Surgeon: Volney Lye, MD;  Location: [facility];  Service: General;  Laterality: Left;   DILATION AND CURETTAGE OF  UTERUS  2000   IR IMAGING GUIDED PORT INSERTION  08/08/2024   THORACENTESIS Left 08/2018   THORACOTOMY Left 09/27/2018   Procedure: Bronchoscopy with pleural biopsy and talc  pleurodesis;  Surgeon: Volney Lye, MD;  Location: [facility];  Service: General;  Laterality: Left;   TUBAL LIGATION      FAMILY HISTORY: Family History  Problem Relation Age of Onset   Heart attack Mother    Heart attack Father    Hypertension Sister    Hypertension Brother     ADVANCED DIRECTIVES (Y/N):  N  HEALTH MAINTENANCE: Social History[1]   Colonoscopy:  PAP:  Bone density:  Lipid panel:  Allergies[2]  Current Outpatient Medications  Medication Sig Dispense Refill   acetaminophen  (TYLENOL ) 500 MG tablet Take 500 mg by mouth every 6 (six) hours as needed.     amLODipine  (NORVASC ) 10 MG tablet Take 10 mg by mouth daily.     atenolol  (TENORMIN ) 25 MG tablet Take 25 mg by mouth daily.     atorvastatin  (LIPITOR) 40 MG tablet Take 1 tablet by mouth daily. Takes in evening  4   Cholecalciferol  (VITAMIN D3) 25 MCG (1000 UT) CAPS Take 2,000 Int'l Units by mouth daily.     furosemide  (LASIX ) 20 MG tablet Take 1 tablet (20 mg total) by mouth daily. 30 tablet 11   hydroxychloroquine  (PLAQUENIL ) 200 MG tablet Take by mouth daily. Takes in the evening for gout     lidocaine -prilocaine  (EMLA ) cream Apply to affected area once 30 g 3   losartan  (COZAAR ) 100 MG tablet Take 100 mg by mouth daily. Takes in the morning     ondansetron  (ZOFRAN ) 8 MG tablet Take 1 tablet (8 mg total) by mouth every 8 (eight) hours as needed for nausea or vomiting. Start on the third day after chemotherapy. 60 tablet 2   potassium chloride  SA (K-DUR,KLOR-CON ) 20 MEQ tablet Take 1 tablet (20 mEq total) by mouth daily. 30 tablet 5   prochlorperazine  (COMPAZINE ) 10 MG tablet Take 1 tablet (10 mg total) by mouth every 6 (six) hours as needed for nausea or vomiting. 60 tablet 2   sodium bicarbonate 650 MG tablet Take 650 mg by mouth 2 (two)  times daily.     triamterene -hydrochlorothiazide  (MAXZIDE ) 75-50 MG tablet Take 1 tablet by mouth daily.     No current facility-administered medications for this visit.    OBJECTIVE: Vitals:   08/16/24 0843 08/16/24 0845  BP: (!) 161/82 (!) 160/80  Pulse: 66   Resp: 16   Temp: (!) 96.1 F (35.6 C)   SpO2: 99%      Body mass index is 40.1 kg/m.    ECOG FS:0 - Asymptomatic  General: Well-developed, well-nourished, no acute distress.  Sitting in a wheelchair. Eyes: Pink conjunctiva, anicteric sclera. HEENT: Normocephalic, moist mucous membranes. Lungs: No audible wheezing or coughing. Heart: Regular rate and rhythm. Abdomen: Soft, nontender, no obvious distention.  Colostomy bag noted. Musculoskeletal: No edema, cyanosis, or clubbing. Neuro: Alert, answering all questions appropriately. Cranial nerves grossly intact. Skin: No rashes or petechiae noted. Psych: Normal affect.  LAB RESULTS:  Lab Results  Component Value Date   NA 138 08/16/2024   K 4.2 08/16/2024  CL 102 08/16/2024   CO2 24 08/16/2024   GLUCOSE 104 (H) 08/16/2024   BUN 14 08/16/2024   CREATININE 1.52 (H) 08/16/2024   CALCIUM  9.3 08/16/2024   PROT 6.8 08/16/2024   ALBUMIN 3.4 (L) 08/16/2024   AST 25 08/16/2024   ALT 13 08/16/2024   ALKPHOS 143 (H) 08/16/2024   BILITOT 0.3 08/16/2024   GFRNONAA 36 (L) 08/16/2024   GFRAA 33 (L) 10/21/2018    Lab Results  Component Value Date   WBC 7.1 08/16/2024   NEUTROABS 2.7 08/16/2024   HGB 9.4 (L) 08/16/2024   HCT 31.2 (L) 08/16/2024   MCV 81.9 08/16/2024   PLT 192 08/16/2024     STUDIES: IR IMAGING GUIDED PORT INSERTION Result Date: 08/08/2024 INDICATION: 74 year old with colon cancer. EXAM: FLUOROSCOPIC AND ULTRASOUND GUIDED PLACEMENT OF A SUBCUTANEOUS PORT MEDICATIONS: Moderate sedation ANESTHESIA/SEDATION: Moderate (conscious) sedation was employed during this procedure. A total of Versed  2 mg and fentanyl  100 mcg was administered intravenously at the  order of the provider performing the procedure. Total intra-service moderate sedation time: 30 minutes. Patient's level of consciousness and vital signs were monitored continuously by radiology nurse throughout the procedure under the supervision of the provider performing the procedure. FLUOROSCOPY TIME:  Radiation Exposure Index (as provided by the fluoroscopic device): 2 mGy Kerma COMPLICATIONS: None immediate. PROCEDURE: The procedure, risks, benefits, and alternatives were explained to the patient. Questions regarding the procedure were encouraged and answered. The patient understands and consents to the procedure. Patient was placed supine on the interventional table. Ultrasound confirmed a patent right internal jugular vein. Ultrasound image was saved for documentation. The right chest and neck were cleaned with a skin antiseptic and a sterile drape was placed. Maximal barrier sterile technique was utilized including caps, mask, sterile gowns, sterile gloves, sterile drape, hand hygiene and skin antiseptic. The right neck was anesthetized with 1% lidocaine . Small incision was made in the right neck with a blade. Micropuncture set was placed in the right internal jugular vein with ultrasound guidance. The micropuncture wire was used for measurement purposes. The right chest was anesthetized with 1% lidocaine  with epinephrine . #15 blade was used to make an incision and a subcutaneous port pocket was formed. 8 french Power Port was assembled. Subcutaneous tunnel was formed with a stiff tunneling device. The port catheter was brought through the subcutaneous tunnel. The port was placed in the subcutaneous pocket. The micropuncture set was exchanged for a peel-away sheath. The catheter was placed through the peel-away sheath and the tip was positioned at the superior cavoatrial junction. Catheter placement was confirmed with fluoroscopy. The port was accessed and flushed with heparinized saline. The port pocket  was closed using two layers of absorbable sutures and Dermabond. The vein skin site was closed using a single layer of absorbable suture and Dermabond. Sterile dressings were applied. Patient tolerated the procedure well without an immediate complication. Ultrasound and fluoroscopic images were taken and saved for this procedure. IMPRESSION: Placement of a subcutaneous power-injectable port device. Catheter tip at the superior cavoatrial junction. Electronically Signed   By: Juliene Balder M.D.   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  She was noted to have 1 of 21 lymph nodes positive for disease.  CT scan of the abdomen and pelvis on June 24, 2024 at [facility] did not reveal  any metastatic disease.  Postoperative CEA was reported within normal limits.  Tumor exhibits a microsatellite stable (MSS) phenotype.  Patient will benefit from adjuvant FOLFOX every 2 weeks for 12 cycles.  Proceed with cycle 1 of treatment today.  Return to clinic in 2 days for pump removal, in 1 week for laboratory work and further evaluation, and then in 2 weeks for further evaluation and consideration of cycle 2.  Renal insufficiency: Chronic and unchanged.  Patient's most recent GFR is 36.  Monitor. Anemia: Patient's hemoglobin decreased at 9.4.  She had a mildly decreased iron saturation ratio of 8%, but the remainder of her iron panel was within normal limits.  Proceed with treatment as above.   Ostomy: Continue follow-up with home health care as well as ostomy nurse.  I spent a total of 30 minutes reviewing chart data, face-to-face evaluation with the patient, counseling and coordination of care as detailed above.    Patient expressed understanding and was in agreement with this plan. She also understands that She can call clinic at any time with any  questions, concerns, or complaints.    Cancer Staging  Adenocarcinoma, colon (HCC) Staging form: Colon and Rectum, AJCC 8th Edition - Pathologic stage from 07/28/2024: Stage IIIB (pT3, pN1a, cM0) - Signed by Jacobo Evalene PARAS, MD on 07/28/2024 Stage prefix: Initial diagnosis Total positive nodes: 1   Evalene PARAS Jacobo, MD   08/16/2024 9:01 AM          [1]  Social History Tobacco Use   Smoking status: Former    Current packs/day: 0.00    Average packs/day: 1.0 packs/day    Types: Cigarettes    Quit date: 2008    Years since quitting: 18.0   Smokeless tobacco: Never  Vaping Use   Vaping status: Never Used  Substance Use Topics   Alcohol use: No   Drug use: No  [2]  Allergies Allergen Reactions   Ibuprofen Other (See Comments)    Can not take due to kidney disease   Codeine     'makes me feel funny'   "

## 2024-08-16 NOTE — Patient Instructions (Signed)
 [facility] - A DEPT OF [facility]  Discharge Instructions: Thank you for choosing [facility] to provide your oncology and hematology care.  If you have a lab appointment with the Cancer Center, please go directly to the Cancer Center and check in at the registration area.  Wear comfortable clothing and clothing appropriate for easy access to any Portacath or PICC line.   We strive to give you quality time with your provider. You may need to reschedule your appointment if you arrive late (15 or more minutes).  Arriving late affects you and other patients whose appointments are after yours.  Also, if you miss three or more appointments without notifying the office, you may be dismissed from the clinic at the provider's discretion.      For prescription refill requests, have your pharmacy contact our office and allow 72 hours for refills to be completed.    Today you received the following chemotherapy and/or immunotherapy agents FOLFOX      To help prevent nausea and vomiting after your treatment, we encourage you to take your nausea medication as directed.  BELOW ARE SYMPTOMS THAT SHOULD BE REPORTED IMMEDIATELY: *FEVER GREATER THAN 100.4 F (38 C) OR HIGHER *CHILLS OR SWEATING *NAUSEA AND VOMITING THAT IS NOT CONTROLLED WITH YOUR NAUSEA MEDICATION *UNUSUAL SHORTNESS OF BREATH *UNUSUAL BRUISING OR BLEEDING *URINARY PROBLEMS (pain or burning when urinating, or frequent urination) *BOWEL PROBLEMS (unusual diarrhea, constipation, pain near the anus) TENDERNESS IN MOUTH AND THROAT WITH OR WITHOUT PRESENCE OF ULCERS (sore throat, sores in mouth, or a toothache) UNUSUAL RASH, SWELLING OR PAIN  UNUSUAL VAGINAL DISCHARGE OR ITCHING   Items with * indicate a potential emergency and should be followed up as soon as possible or go to the Emergency Department if any problems should occur.  Please show the CHEMOTHERAPY ALERT CARD or IMMUNOTHERAPY ALERT  CARD at check-in to the Emergency Department and triage nurse.  Should you have questions after your visit or need to cancel or reschedule your appointment, please contact [facility] - A DEPT OF [facility]  [phone] and follow the prompts.  Office hours are 8:00 a.m. to 4:30 p.m. Monday - Friday. Please note that voicemails left after 4:00 p.m. may not be returned until the following business day.  We are closed weekends and major holidays. You have access to a nurse at all times for urgent questions. Please call the main number to the clinic [phone] and follow the prompts.  For any non-urgent questions, you may also contact your provider using MyChart. We now offer e-Visits for anyone 60 and older to request care online for non-urgent symptoms. For details visit [email or web address].   Also download the MyChart app! Go to the app store, search "MyChart", open the app, select [place], and log in with your MyChart username and password.

## 2024-08-16 NOTE — Progress Notes (Signed)
 Patient tolerated initial infusion well, no questions/concerns voiced. Education complete. Patient verbalizes understanding, no questions. Patient stable at discharge. AVS given.

## 2024-08-16 NOTE — Progress Notes (Signed)
 Ok per MD with Crt 1.52

## 2024-08-17 ENCOUNTER — Telehealth: Payer: Self-pay

## 2024-08-17 ENCOUNTER — Other Ambulatory Visit: Payer: Self-pay

## 2024-08-17 NOTE — Telephone Encounter (Signed)
 Follow up call after receiving first chemo but no answer and voice mail not activated.

## 2024-08-18 ENCOUNTER — Inpatient Hospital Stay

## 2024-08-22 ENCOUNTER — Telehealth: Payer: Self-pay | Admitting: Oncology

## 2024-08-22 NOTE — Telephone Encounter (Signed)
------------------------------------------------------------------------------- °  Summary: WOCN -------------------------------------------------------------------------------  [facility] Consult Services  Telephone Call  Service: Surg onc     Reason For Call: - Pouching problem - Skin irritation  Assessment:  Pt reports still having problems keeping a pouch intact; sample trial of soft convexity and barrier ring placement in umbilical leaked >24 hours, pt wears belt (not consistently).   Actions Taken: -  Provided pt with demonstration videos' barrier ring placement, belts explained.  Sent link for ordering samples - [email or web address] [employer] - [id number] / [id number] with barrier ring [id number] [employer] - [id number] / [id number] with [place] barrier ring [id number] Instructions Provided: -  Sizing ostomy wafer -  Application of barrier ring -  Use of ostomy belt -  Use of convexity -  Please contact [phone] should have trouble requesting samples   Follow Up: -  Outpatient WOCN will follow-up with patient in 7-10 days  Workup Time:  45 minutes  Asberry Palma MSN RN [facility] Consult Services  [phone] [email or web address]

## 2024-08-23 ENCOUNTER — Inpatient Hospital Stay

## 2024-08-23 ENCOUNTER — Encounter: Payer: Self-pay | Admitting: Oncology

## 2024-08-23 ENCOUNTER — Inpatient Hospital Stay: Admitting: Oncology

## 2024-08-23 ENCOUNTER — Inpatient Hospital Stay: Attending: Oncology

## 2024-08-23 ENCOUNTER — Other Ambulatory Visit: Payer: Self-pay

## 2024-08-23 DIAGNOSIS — C189 Malignant neoplasm of colon, unspecified: Secondary | ICD-10-CM

## 2024-08-23 NOTE — Progress Notes (Signed)
 Multidisciplinary Oncology Council Documentation  Aireana Ryland was presented by our [facility] on 08/23/2024, which included representatives from:  Palliative Care Dietitian  Physical/Occupational Therapist Nurse Navigator Genetics Social work Survivorship RN Financial Navigator Research RN   Shanitra currently presents with history of colon cancer  We reviewed previous medical and familial history, history of present illness, and recent lab results along with all available histopathologic and imaging studies. The MOC considered available treatment options and made the following recommendations/referrals:  Nutrition  The MOC is a meeting of clinicians from various specialty areas who evaluate and discuss patients for whom a multidisciplinary approach is being considered. Final determinations in the plan of care are those of the provider(s).   Today's extended care, comprehensive team conference, Elisabella was not present for the discussion and was not examined.

## 2024-08-23 NOTE — Progress Notes (Signed)
 This encounter was created in error - please disregard.

## 2024-08-28 ENCOUNTER — Inpatient Hospital Stay

## 2024-08-28 ENCOUNTER — Inpatient Hospital Stay: Admitting: Oncology

## 2024-08-30 ENCOUNTER — Inpatient Hospital Stay

## 2024-08-30 ENCOUNTER — Inpatient Hospital Stay: Admitting: Oncology

## 2024-09-01 ENCOUNTER — Inpatient Hospital Stay

## 2024-09-11 ENCOUNTER — Inpatient Hospital Stay

## 2024-09-11 ENCOUNTER — Inpatient Hospital Stay: Admitting: Oncology

## 2024-09-13 ENCOUNTER — Inpatient Hospital Stay

## 2024-09-13 ENCOUNTER — Inpatient Hospital Stay: Admitting: Oncology

## 2024-09-15 ENCOUNTER — Inpatient Hospital Stay

## 2024-09-25 ENCOUNTER — Inpatient Hospital Stay

## 2024-09-25 ENCOUNTER — Inpatient Hospital Stay: Admitting: Oncology

## 2024-09-27 ENCOUNTER — Inpatient Hospital Stay

## 2024-09-27 ENCOUNTER — Inpatient Hospital Stay: Admitting: Oncology

## 2024-09-29 ENCOUNTER — Inpatient Hospital Stay

## 2024-10-09 ENCOUNTER — Inpatient Hospital Stay: Admitting: Oncology

## 2024-10-09 ENCOUNTER — Inpatient Hospital Stay

## 2024-10-11 ENCOUNTER — Inpatient Hospital Stay: Admitting: Oncology

## 2024-10-11 ENCOUNTER — Inpatient Hospital Stay

## 2024-10-13 ENCOUNTER — Inpatient Hospital Stay

## 2024-10-23 ENCOUNTER — Inpatient Hospital Stay

## 2024-10-23 ENCOUNTER — Inpatient Hospital Stay: Admitting: Nurse Practitioner

## 2024-10-25 ENCOUNTER — Inpatient Hospital Stay

## 2024-10-25 ENCOUNTER — Inpatient Hospital Stay: Admitting: Nurse Practitioner

## 2024-10-27 ENCOUNTER — Inpatient Hospital Stay

## 2024-11-06 ENCOUNTER — Inpatient Hospital Stay

## 2024-11-06 ENCOUNTER — Inpatient Hospital Stay: Admitting: Oncology

## 2024-11-08 ENCOUNTER — Inpatient Hospital Stay

## 2024-11-20 ENCOUNTER — Inpatient Hospital Stay

## 2024-11-20 ENCOUNTER — Inpatient Hospital Stay: Admitting: Oncology
